# Patient Record
Sex: Female | Born: 1935 | Race: White | Hispanic: No | Marital: Married | Smoking: Never smoker
Health system: Southern US, Community
[De-identification: ages and names within clinical notes are randomized; demographics above are authoritative.]

## PROBLEM LIST (undated history)

## (undated) DIAGNOSIS — C801 Malignant (primary) neoplasm, unspecified: Secondary | ICD-10-CM

## (undated) DIAGNOSIS — E079 Disorder of thyroid, unspecified: Secondary | ICD-10-CM

## (undated) DIAGNOSIS — K56609 Unspecified intestinal obstruction, unspecified as to partial versus complete obstruction: Secondary | ICD-10-CM

## (undated) HISTORY — PX: TONSILLECTOMY: SUR1361

## (undated) HISTORY — PX: APPENDECTOMY: SHX54

## (undated) HISTORY — PX: BREAST SURGERY: SHX581

## (undated) HISTORY — PX: ABDOMINAL HYSTERECTOMY: SHX81

---

## 2016-10-19 ENCOUNTER — Emergency Department (HOSPITAL_BASED_OUTPATIENT_CLINIC_OR_DEPARTMENT_OTHER): Payer: Medicare Other

## 2016-10-19 ENCOUNTER — Inpatient Hospital Stay (HOSPITAL_BASED_OUTPATIENT_CLINIC_OR_DEPARTMENT_OTHER)
Admission: EM | Admit: 2016-10-19 | Discharge: 2016-10-25 | DRG: 390 | Disposition: A | Discharge status: Home / Self Care | Payer: Medicare Other | Attending: Internal Medicine | Admitting: Internal Medicine

## 2016-10-19 ENCOUNTER — Encounter (HOSPITAL_BASED_OUTPATIENT_CLINIC_OR_DEPARTMENT_OTHER): Payer: Self-pay | Admitting: Emergency Medicine

## 2016-10-19 DIAGNOSIS — E119 Type 2 diabetes mellitus without complications: Secondary | ICD-10-CM

## 2016-10-19 DIAGNOSIS — Z79899 Other long term (current) drug therapy: Secondary | ICD-10-CM

## 2016-10-19 DIAGNOSIS — E039 Hypothyroidism, unspecified: Secondary | ICD-10-CM | POA: Diagnosis not present

## 2016-10-19 DIAGNOSIS — Z8542 Personal history of malignant neoplasm of other parts of uterus: Secondary | ICD-10-CM

## 2016-10-19 DIAGNOSIS — E875 Hyperkalemia: Secondary | ICD-10-CM | POA: Diagnosis not present

## 2016-10-19 DIAGNOSIS — Z9049 Acquired absence of other specified parts of digestive tract: Secondary | ICD-10-CM

## 2016-10-19 DIAGNOSIS — Z4659 Encounter for fitting and adjustment of other gastrointestinal appliance and device: Secondary | ICD-10-CM

## 2016-10-19 DIAGNOSIS — R109 Unspecified abdominal pain: Secondary | ICD-10-CM | POA: Diagnosis not present

## 2016-10-19 DIAGNOSIS — Z0189 Encounter for other specified special examinations: Secondary | ICD-10-CM

## 2016-10-19 DIAGNOSIS — K589 Irritable bowel syndrome without diarrhea: Secondary | ICD-10-CM | POA: Diagnosis present

## 2016-10-19 DIAGNOSIS — Z853 Personal history of malignant neoplasm of breast: Secondary | ICD-10-CM

## 2016-10-19 DIAGNOSIS — E876 Hypokalemia: Secondary | ICD-10-CM | POA: Diagnosis not present

## 2016-10-19 DIAGNOSIS — K56609 Unspecified intestinal obstruction, unspecified as to partial versus complete obstruction: Secondary | ICD-10-CM | POA: Diagnosis not present

## 2016-10-19 DIAGNOSIS — Z90710 Acquired absence of both cervix and uterus: Secondary | ICD-10-CM

## 2016-10-19 DIAGNOSIS — Z800 Family history of malignant neoplasm of digestive organs: Secondary | ICD-10-CM

## 2016-10-19 DIAGNOSIS — Z8249 Family history of ischemic heart disease and other diseases of the circulatory system: Secondary | ICD-10-CM

## 2016-10-19 DIAGNOSIS — Z79890 Hormone replacement therapy (postmenopausal): Secondary | ICD-10-CM

## 2016-10-19 DIAGNOSIS — R030 Elevated blood-pressure reading, without diagnosis of hypertension: Secondary | ICD-10-CM | POA: Diagnosis present

## 2016-10-19 DIAGNOSIS — K56600 Partial intestinal obstruction, unspecified as to cause: Principal | ICD-10-CM | POA: Diagnosis present

## 2016-10-19 DIAGNOSIS — Z808 Family history of malignant neoplasm of other organs or systems: Secondary | ICD-10-CM

## 2016-10-19 HISTORY — DX: Malignant (primary) neoplasm, unspecified: C80.1

## 2016-10-19 HISTORY — DX: Unspecified intestinal obstruction, unspecified as to partial versus complete obstruction: K56.609

## 2016-10-19 LAB — CBC WITH DIFFERENTIAL/PLATELET
BASOS ABS: 0 10*3/uL (ref 0.0–0.1)
BASOS PCT: 0 %
EOS ABS: 0.1 10*3/uL (ref 0.0–0.7)
EOS PCT: 1 %
HCT: 37.9 % (ref 36.0–46.0)
Hemoglobin: 12.8 g/dL (ref 12.0–15.0)
LYMPHS PCT: 16 %
Lymphs Abs: 1.8 10*3/uL (ref 0.7–4.0)
MCH: 33.9 pg (ref 26.0–34.0)
MCHC: 33.8 g/dL (ref 30.0–36.0)
MCV: 100.3 fL — AB (ref 78.0–100.0)
Monocytes Absolute: 1.1 10*3/uL — ABNORMAL HIGH (ref 0.1–1.0)
Monocytes Relative: 10 %
Neutro Abs: 8 10*3/uL — ABNORMAL HIGH (ref 1.7–7.7)
Neutrophils Relative %: 73 %
PLATELETS: 282 10*3/uL (ref 150–400)
RBC: 3.78 MIL/uL — AB (ref 3.87–5.11)
RDW: 11.9 % (ref 11.5–15.5)
WBC: 11 10*3/uL — AB (ref 4.0–10.5)

## 2016-10-19 LAB — COMPREHENSIVE METABOLIC PANEL
ALT: 16 U/L (ref 14–54)
AST: 23 U/L (ref 15–41)
Albumin: 4.3 g/dL (ref 3.5–5.0)
Alkaline Phosphatase: 86 U/L (ref 38–126)
Anion gap: 10 (ref 5–15)
BUN: 14 mg/dL (ref 6–20)
CALCIUM: 9.5 mg/dL (ref 8.9–10.3)
CO2: 27 mmol/L (ref 22–32)
Chloride: 98 mmol/L — ABNORMAL LOW (ref 101–111)
Creatinine, Ser: 0.72 mg/dL (ref 0.44–1.00)
GFR calc Af Amer: >60 mL/min (ref >60)
GFR calc non Af Amer: >60 mL/min (ref >60)
GLUCOSE: 110 mg/dL — AB (ref 65–99)
POTASSIUM: 4.1 mmol/L (ref 3.5–5.1)
SODIUM: 135 mmol/L (ref 135–145)
TOTAL PROTEIN: 7.5 g/dL (ref 6.5–8.1)
Total Bilirubin: 0.6 mg/dL (ref 0.3–1.2)

## 2016-10-19 LAB — URINALYSIS, MICROSCOPIC (REFLEX)

## 2016-10-19 LAB — URINALYSIS, ROUTINE W REFLEX MICROSCOPIC
Bilirubin Urine: NEGATIVE
GLUCOSE, UA: NEGATIVE mg/dL
Hgb urine dipstick: NEGATIVE
KETONES UR: NEGATIVE mg/dL
NITRITE: NEGATIVE
PROTEIN: NEGATIVE mg/dL
Specific Gravity, Urine: 1.045 — ABNORMAL HIGH (ref 1.005–1.030)
pH: 7.5 (ref 5.0–8.0)

## 2016-10-19 LAB — GLUCOSE, CAPILLARY: GLUCOSE-CAPILLARY: 150 mg/dL — AB (ref 65–99)

## 2016-10-19 LAB — LIPASE, BLOOD: LIPASE: 26 U/L (ref 11–51)

## 2016-10-19 MED ORDER — ACETAMINOPHEN 650 MG RE SUPP: 650.0000 mg | Freq: Four times a day (QID) | RECTAL | Status: DC | PRN

## 2016-10-19 MED ORDER — SODIUM CHLORIDE 0.9 % IV SOLN
INTRAVENOUS | Status: DC
  Administered 2016-10-19 – 2016-10-21 (×4): via INTRAVENOUS
  Administered 2016-10-21 – 2016-10-23 (×2): 1000 mL via INTRAVENOUS

## 2016-10-19 MED ORDER — ACETAMINOPHEN 325 MG PO TABS: 650.0000 mg | ORAL_TABLET | Freq: Four times a day (QID) | ORAL | Status: DC | PRN

## 2016-10-19 MED ORDER — FENTANYL CITRATE (PF) 100 MCG/2ML IJ SOLN
50.0000 ug | Freq: Once | INTRAMUSCULAR | Status: AC
  Administered 2016-10-19: 50 ug via INTRAVENOUS
  Filled 2016-10-19: qty 2

## 2016-10-19 MED ORDER — ONDANSETRON HCL 4 MG/2ML IJ SOLN
4.0000 mg | Freq: Once | INTRAMUSCULAR | Status: AC
  Administered 2016-10-19: 4 mg via INTRAVENOUS
  Filled 2016-10-19: qty 2

## 2016-10-19 MED ORDER — LEVOTHYROXINE SODIUM 100 MCG IV SOLR
25.0000 ug | Freq: Every day | INTRAVENOUS | Status: DC
  Administered 2016-10-20 – 2016-10-24 (×5): 25 ug via INTRAVENOUS
  Filled 2016-10-19 (×6): qty 5

## 2016-10-19 MED ORDER — LIDOCAINE HCL 2 % EX GEL
1.0000 "application " | Freq: Once | CUTANEOUS | Status: AC
  Administered 2016-10-19: 1 via TOPICAL
  Filled 2016-10-19: qty 20

## 2016-10-19 MED ORDER — SODIUM CHLORIDE 0.9 % IV BOLUS (SEPSIS)
500.0000 mL | Freq: Once | INTRAVENOUS | Status: AC
  Administered 2016-10-19: 500 mL via INTRAVENOUS

## 2016-10-19 MED ORDER — IOPAMIDOL (ISOVUE-300) INJECTION 61%
100.0000 mL | Freq: Once | INTRAVENOUS | Status: AC | PRN
  Administered 2016-10-19: 100 mL via INTRAVENOUS

## 2016-10-19 MED ORDER — HYDRALAZINE HCL 20 MG/ML IJ SOLN
10.0000 mg | Freq: Four times a day (QID) | INTRAMUSCULAR | Status: DC | PRN
  Administered 2016-10-19: 10 mg via INTRAVENOUS
  Filled 2016-10-19: qty 0.5

## 2016-10-19 MED ORDER — INSULIN ASPART 100 UNIT/ML ~~LOC~~ SOLN
0.0000 [IU] | SUBCUTANEOUS | Status: DC
  Administered 2016-10-19 – 2016-10-24 (×11): 1 [IU] via SUBCUTANEOUS

## 2016-10-19 MED ORDER — ONDANSETRON HCL 4 MG/2ML IJ SOLN
4.0000 mg | Freq: Four times a day (QID) | INTRAMUSCULAR | Status: DC | PRN
  Administered 2016-10-19 – 2016-10-20 (×3): 4 mg via INTRAVENOUS
  Filled 2016-10-19 (×3): qty 2

## 2016-10-19 MED ORDER — MORPHINE SULFATE (PF) 2 MG/ML IV SOLN
2.0000 mg | INTRAVENOUS | Status: DC | PRN
  Administered 2016-10-19 – 2016-10-22 (×2): 2 mg via INTRAVENOUS
  Filled 2016-10-19 (×2): qty 1

## 2016-10-19 MED ORDER — ENOXAPARIN SODIUM 40 MG/0.4ML ~~LOC~~ SOLN
40.0000 mg | SUBCUTANEOUS | Status: DC
  Administered 2016-10-19 – 2016-10-24 (×6): 40 mg via SUBCUTANEOUS
  Filled 2016-10-19 (×6): qty 0.4

## 2016-10-19 NOTE — ED Triage Notes (Signed)
Pt reports sharp shooting pains across upper abdomen since 0900.  Pt reports last bowel movt this morning and has tried pepto and green tea with no relief at home.  Pt states she had an intestinal blockage 20 years ago and this reminds her of that pain.  Comes in waves.

## 2016-10-19 NOTE — ED Provider Notes (Signed)
[facility] [place] DEPT [facility] Provider Note   CSN: [id number] Arrival date & time: 10/19/16  1244  By signing my name below, I, [name], attest that this documentation has been prepared under the direction and in the presence of Hongalgi, Lenis Dickinson, MD. Electronically Signed: [name], Scribe. 10/19/16. 3:32 PM. History   Chief Complaint Chief Complaint  Patient presents with  . Abdominal Pain   The history is provided by the patient. No language interpreter was used.    HPI Comments: Kendra Ortega is a 83 y.o. female with PMHx of DM who presents to the Emergency Department complaining of waxing/waning sharp abdominal pain since this morning. She states when the pain is not sharp, it is a constant soreness. She has associated abdominal swelling, nausea, vomiting (1 episode with clear liquid), and constipation (her last BM was normal this morning). She has tried [employer] and drinking green tea with no relief. When these did not help, she went to a nurse at her assisted living home, they took her vitals and she reported high blood pressure of 175/83, who recommended she come to the ED. 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 She denies fever, CP, SOB, diarrhea, and dysuria. She has NKDA. Past Medical History:  Diagnosis Date  . Bowel obstruction ([place])   . Cancer [facility])    right breast cancer    Patient Active Problem List   Diagnosis Date Noted  . Small bowel obstruction ([place]) 10/19/2016  . SBO (small bowel obstruction) ([place]) 10/19/2016  . Type 2 diabetes mellitus without complication ([place]) 62/22/9798  . Hypothyroidism 10/19/2016  . IBS (irritable bowel syndrome) 10/19/2016  . History of breast cancer 10/19/2016  . History of uterine cancer 10/19/2016    Past Surgical History:  Procedure Laterality Date  . ABDOMINAL HYSTERECTOMY    . APPENDECTOMY    . BREAST SURGERY     lumpectomy  . TONSILLECTOMY      OB History    No  data available       Home Medications    Prior to Admission medications   Medication Sig Start Date End Date Taking? Authorizing Provider  calcium carbonate (CALCIUM 600) 600 MG TABS tablet Take 600 mg by mouth every other day. 01/28/12  Yes [provider]  Cholecalciferol (VITAMIN D3) 1000 units CAPS Take 1,000 Units by mouth every other day. 03/01/13  Yes [provider]  docusate sodium (COLACE) 100 MG capsule Take 100 mg by mouth 2 (two) times daily. 01/28/12  Yes [provider]  glimepiride (AMARYL) 1 MG tablet Take 1 mg by mouth daily with breakfast.   Yes [provider]  levothyroxine (SYNTHROID, LEVOTHROID) 50 MCG tablet Take 50 mcg by mouth daily before breakfast.   Yes [provider]  Multiple Vitamins-Minerals (MULTIVITAMIN WOMEN PO) Take 1 tablet by mouth daily.   Yes [provider]    Family History No family history on file.  Social History Social History  Substance Use Topics  . Smoking status: Never Smoker  . Smokeless tobacco: Never Used  . Alcohol use Yes     Comment: some days     Allergies   Patient has no known allergies.   Review of Systems Review of Systems  Constitutional: Negative for fever.  Respiratory: Negative for shortness of breath.   Cardiovascular: Negative for chest pain.  Gastrointestinal: Positive for abdominal pain, constipation, nausea and vomiting. Negative for diarrhea.  Genitourinary: Negative  for dysuria.  All other systems reviewed and are negative.    Physical Exam Updated Vital Signs BP (!) 186/82 (BP Location: Left Arm)   Pulse 65   Temp 98.1 F (36.7 C) (Oral)   Resp 18   Ht 5' 4.5" (1.638 m)   Wt 73.5 kg (162 lb)   SpO2 97%   BMI 27.38 kg/m   Physical Exam  Constitutional: She is oriented to person, place, and time. She appears well-developed and well-nourished.  HENT:  Head: Normocephalic and atraumatic.  Cardiovascular: Normal rate and regular  rhythm.   No murmur heard. Pulmonary/Chest: Effort normal and breath sounds normal. No respiratory distress.  Abdominal: Soft. She exhibits distension. There is tenderness. There is no rebound and no guarding.  Distended abdomen that is tympanic to percussion Decreased bowel sounds with mild diffuse tenderness No guarding or rebound  Musculoskeletal: She exhibits no edema or tenderness.  Neurological: She is alert and oriented to person, place, and time.  Skin: Skin is warm and dry.  Nonpitting edema to bilateral lower extremities  Psychiatric: She has a normal mood and affect. Her behavior is normal.  Nursing note and vitals reviewed.    ED Treatments / Results  DIAGNOSTIC STUDIES: Oxygen Saturation is 98% on RA, normal by my interpretation.    COORDINATION OF CARE: 3:28 PM Discussed treatment plan with pt at bedside and pt agreed to plan.  Labs (all labs ordered are listed, but only abnormal results are displayed) Labs Reviewed  CBC WITH DIFFERENTIAL/PLATELET - Abnormal; Notable for the following:       Result Value   WBC 11.0 (*)    RBC 3.78 (*)    MCV 100.3 (*)    Neutro Abs 8.0 (*)    Monocytes Absolute 1.1 (*)    All other components within normal limits  COMPREHENSIVE METABOLIC PANEL - Abnormal; Notable for the following:    Chloride 98 (*)    Glucose, Bld 110 (*)    All other components within normal limits  URINALYSIS, ROUTINE W REFLEX MICROSCOPIC - Abnormal; Notable for the following:    Specific Gravity, Urine 1.045 (*)    Leukocytes, UA MODERATE (*)    All other components within normal limits  URINALYSIS, MICROSCOPIC (REFLEX) - Abnormal; Notable for the following:    Bacteria, UA MANY (*)    Squamous Epithelial / LPF 0-5 (*)    All other components within normal limits  GLUCOSE, CAPILLARY - Abnormal; Notable for the following:    Glucose-Capillary 150 (*)    All other components within normal limits  URINE CULTURE  LIPASE, BLOOD  APTT  PROTIME-INR     EKG  EKG Interpretation  Date/Time:  Saturday October 19 2016 15:37:20 EDT Ventricular Rate:  55 PR Interval:    QRS Duration: 102 QT Interval:  428 QTC Calculation: 410 R Axis:   31 Text Interpretation:  Sinus rhythm Low voltage, precordial leads Baseline wander in lead(s) II Confirmed by Hazle Coca [phone]) on 10/19/2016 4:05:11 PM       Radiology Ct Abdomen Pelvis W Contrast  Addendum Date: 10/19/2016   ADDENDUM REPORT: 10/19/2016 16:38 ADDENDUM: Voice recognition error in the first sentence of the first impression which should read Evidence for small bowel obstruction in the mid to distal small bowel. Electronically Signed   By: Rolm Baptise M.D.   On: 10/19/2016 16:38   Result Date: 10/19/2016 CLINICAL DATA:  Upper abdominal pain.  History of bowel obstruction. EXAM: CT ABDOMEN AND PELVIS WITH  CONTRAST TECHNIQUE: Multidetector CT imaging of the abdomen and pelvis was performed using the standard protocol following bolus administration of intravenous contrast. CONTRAST:  186mL ISOVUE-300 IOPAMIDOL (ISOVUE-300) INJECTION 61% COMPARISON:  None. FINDINGS: Lower chest: Linear scarring in the lung bases. No effusions. Heart is normal size. Hepatobiliary: No focal hepatic abnormality. Gallbladder unremarkable. Pancreas: No focal abnormality or ductal dilatation. Spleen: No focal abnormality.  Normal size. Adrenals/Urinary Tract: Small low-density nodule in the right adrenal gland, difficult to characterize on this contrast-enhanced study, but likely a small adenoma. This measures 15 mm. Left adrenal gland and kidneys are unremarkable. Urinary bladder unremarkable. Stomach/Bowel: Dilated proximal and mid small bowel loops. Distal small bowel loops are decompressed. Findings compatible with small bowel obstruction. Exact transition and cause not identified. Vascular/Lymphatic: Aortic and iliac calcifications. No aneurysm or adenopathy. Reproductive: Prior hysterectomy.  No adnexal masses. Other:  Trace free fluid in the pelvis.  No free air. Musculoskeletal: No acute bony abnormality. IMPRESSION: No evidence for small bowel obstruction in the mid to distal small bowel. Exact transition and cause not visualized. Prior hysterectomy. Aortoiliac atherosclerosis. Electronically Signed: By: Rolm Baptise M.D. On: 10/19/2016 16:23    Procedures Procedures (including critical care time)  Medications Ordered in ED Medications  ondansetron (ZOFRAN) injection 4 mg (4 mg Intravenous Given 10/19/16 2143)  levothyroxine (SYNTHROID, LEVOTHROID) injection 25 mcg (not administered)  insulin aspart (novoLOG) injection 0-9 Units (1 Units Subcutaneous Given 10/19/16 2249)  enoxaparin (LOVENOX) injection 40 mg (40 mg Subcutaneous Given 10/19/16 2235)  0.9 %  sodium chloride infusion ( Intravenous New Bag/Given 10/19/16 2144)  acetaminophen (TYLENOL) tablet 650 mg (not administered)    Or  acetaminophen (TYLENOL) suppository 650 mg (not administered)  morphine 2 MG/ML injection 2 mg (2 mg Intravenous Given 10/19/16 2250)  hydrALAZINE (APRESOLINE) injection 10 mg (10 mg Intravenous Given 10/19/16 2235)  ondansetron (ZOFRAN) injection 4 mg (4 mg Intravenous Given 10/19/16 1519)  ondansetron (ZOFRAN) injection 4 mg (4 mg Intravenous Given 10/19/16 1606)  sodium chloride 0.9 % bolus 500 mL (0 mLs Intravenous Stopped 10/19/16 1841)  fentaNYL (SUBLIMAZE) injection 50 mcg (50 mcg Intravenous Given 10/19/16 1606)  iopamidol (ISOVUE-300) 61 % injection 100 mL (100 mLs Intravenous Contrast Given 10/19/16 1547)  lidocaine (XYLOCAINE) 2 % jelly 1 application (1 application Topical Given 10/19/16 1729)  fentaNYL (SUBLIMAZE) injection 50 mcg (50 mcg Intravenous Given 10/19/16 1752)     Initial Impression / Assessment and Plan / ED Course  I have reviewed the triage vital signs and the nursing notes.  Pertinent labs & imaging results that were available during my care of the patient were reviewed by me and considered in my  medical decision making (see chart for details).    Patient here with upper abdominal pain, vomiting. She has a history of similar symptoms with obstruction requiring surgical treatment in the past. NG tube placed for decompression and patient comfort.  Discussed with Dr. Marlou Starks with general surgery. He recommends transfer to [facility] long with medicine consult. Hospitalist contacted for admission - discussed with Dr. Algis Liming. Patient updated findings of studies and recommendation for admission and she is in agreement with plan.  Final Clinical Impressions(s) / ED Diagnoses   Final diagnoses:  Small bowel obstruction [facility])    New Prescriptions Current Discharge Medication List    I personally performed the services described in this documentation, which was scribed in my presence. The recorded information has been reviewed and is accurate.    Quintella Reichert, MD 10/19/16 2258

## 2016-10-19 NOTE — Progress Notes (Signed)
Acceptance note  I received a call from Dr. Marin Roberts, EDP at [facility] regarding accepting this patient for admission to [facility].  83 year old female, PMH of bowel obstruction requiring surgical repair several years ago, type II DM, hypothyroid, IBS, presented with abdominal pain and workup suggests SBO. Per report, apart from mildly elevated blood pressures (not a known hypertensive), hemodynamically stable and not in respiratory distress. Dr. Ayesha Rumpf discussed patient's case with Dr. Marlou Starks, General Surgery who recommended hospitalist admission and he will see in consult. Patient has received IV fluids, pain medications and is awaiting NG tube placement.  Accepted to medical bed, observation status. Please call general surgery when patient arrives to [facility].  [place], MD, FACP, [facility]. [facility] Pager [phone]  If 7PM-7AM, please contact night-coverage [email or web address] Password [email or web address] 10/19/2016, 5:26 PM

## 2016-10-19 NOTE — Consult Note (Signed)
Reason for Consult:bloating Referring Physician: Dr. [name] is an 83 y.o. female.  HPI:  The patient is an 83 year old white female who presents from an assisted living home with abdominal bloating that started this morning. She had a bowel obstruction requiring surgery about 20 years ago and states that this feels similar to the way she felt then. She has had one episode of nausea and vomiting. She has passed some flatus today and had a small bowel movement this morning. Her CT scan shows evidence of small bowel obstruction  Past Medical History:  Diagnosis Date  . Bowel obstruction ([place])   . Cancer [facility])    right breast cancer    Past Surgical History:  Procedure Laterality Date  . ABDOMINAL HYSTERECTOMY    . APPENDECTOMY    . BREAST SURGERY     lumpectomy  . TONSILLECTOMY      No family history on file.  Social History:  reports that she has never smoked. She has never used smokeless tobacco. She reports that she drinks alcohol. She reports that she does not use drugs.  Allergies: No Known Allergies  Medications: I have reviewed the patient's current medications.  Results for orders placed or performed during the hospital encounter of 10/19/16 (from the past 48 hour(s))  CBC with Differential     Status: Abnormal   Collection Time: 10/19/16  2:00 PM  Result Value Ref Range   WBC 11.0 (H) 4.0 - 10.5 K/uL   RBC 3.78 (L) 3.87 - 5.11 MIL/uL   Hemoglobin 12.8 12.0 - 15.0 g/dL   HCT 37.9 36.0 - 46.0 %   MCV 100.3 (H) 78.0 - 100.0 fL   MCH 33.9 26.0 - 34.0 pg   MCHC 33.8 30.0 - 36.0 g/dL   RDW 11.9 11.5 - 15.5 %   Platelets 282 150 - 400 K/uL   Neutrophils Relative % 73 %   Neutro Abs 8.0 (H) 1.7 - 7.7 K/uL   Lymphocytes Relative 16 %   Lymphs Abs 1.8 0.7 - 4.0 K/uL   Monocytes Relative 10 %   Monocytes Absolute 1.1 (H) 0.1 - 1.0 K/uL   Eosinophils Relative 1 %   Eosinophils Absolute 0.1 0.0 - 0.7 K/uL   Basophils Relative 0 %   Basophils Absolute 0.0  0.0 - 0.1 K/uL  Comprehensive metabolic panel     Status: Abnormal   Collection Time: 10/19/16  2:00 PM  Result Value Ref Range   Sodium 135 135 - 145 mmol/L   Potassium 4.1 3.5 - 5.1 mmol/L   Chloride 98 (L) 101 - 111 mmol/L   CO2 27 22 - 32 mmol/L   Glucose, Bld 110 (H) 65 - 99 mg/dL   BUN 14 6 - 20 mg/dL   Creatinine, Ser 0.72 0.44 - 1.00 mg/dL   Calcium 9.5 8.9 - 10.3 mg/dL   Total Protein 7.5 6.5 - 8.1 g/dL   Albumin 4.3 3.5 - 5.0 g/dL   AST 23 15 - 41 U/L   ALT 16 14 - 54 U/L   Alkaline Phosphatase 86 38 - 126 U/L   Total Bilirubin 0.6 0.3 - 1.2 mg/dL   GFR calc non Af Amer >60 >60 mL/min   GFR calc Af Amer >60 >60 mL/min    Comment: (NOTE) The eGFR has been calculated using the CKD EPI equation. This calculation has not been validated in all clinical situations. eGFR's persistently <60 mL/min signify possible Chronic Kidney Disease.    Anion gap 10  5 - 15  Lipase, blood     Status: None   Collection Time: 10/19/16  2:00 PM  Result Value Ref Range   Lipase 26 11 - 51 U/L  Urinalysis, Routine w reflex microscopic     Status: Abnormal   Collection Time: 10/19/16  5:15 PM  Result Value Ref Range   Color, Urine YELLOW YELLOW   APPearance CLEAR CLEAR   Specific Gravity, Urine 1.045 (H) 1.005 - 1.030   pH 7.5 5.0 - 8.0   Glucose, UA NEGATIVE NEGATIVE mg/dL   Hgb urine dipstick NEGATIVE NEGATIVE   Bilirubin Urine NEGATIVE NEGATIVE   Ketones, ur NEGATIVE NEGATIVE mg/dL   Protein, ur NEGATIVE NEGATIVE mg/dL   Nitrite NEGATIVE NEGATIVE   Leukocytes, UA MODERATE (A) NEGATIVE  Urinalysis, Microscopic (reflex)     Status: Abnormal   Collection Time: 10/19/16  5:15 PM  Result Value Ref Range   RBC / HPF 0-5 0 - 5 RBC/hpf   WBC, UA 6-30 0 - 5 WBC/hpf   Bacteria, UA MANY (A) NONE SEEN   Squamous Epithelial / LPF 0-5 (A) NONE SEEN  Glucose, capillary     Status: Abnormal   Collection Time: 10/19/16 10:33 PM  Result Value Ref Range   Glucose-Capillary 150 (H) 65 - 99 mg/dL     Ct Abdomen Pelvis W Contrast  Addendum Date: 10/19/2016   ADDENDUM REPORT: 10/19/2016 16:38 ADDENDUM: Voice recognition error in the first sentence of the first impression which should read Evidence for small bowel obstruction in the mid to distal small bowel. Electronically Signed   By: Kendra Ortega M.D.   On: 10/19/2016 16:38   Result Date: 10/19/2016 CLINICAL DATA:  Upper abdominal pain.  History of bowel obstruction. EXAM: CT ABDOMEN AND PELVIS WITH CONTRAST TECHNIQUE: Multidetector CT imaging of the abdomen and pelvis was performed using the standard protocol following bolus administration of intravenous contrast. CONTRAST:  134m ISOVUE-300 IOPAMIDOL (ISOVUE-300) INJECTION 61% COMPARISON:  None. FINDINGS: Lower chest: Linear scarring in the lung bases. No effusions. Heart is normal size. Hepatobiliary: No focal hepatic abnormality. Gallbladder unremarkable. Pancreas: No focal abnormality or ductal dilatation. Spleen: No focal abnormality.  Normal size. Adrenals/Urinary Tract: Small low-density nodule in the right adrenal gland, difficult to characterize on this contrast-enhanced study, but likely a small adenoma. This measures 15 mm. Left adrenal gland and kidneys are unremarkable. Urinary bladder unremarkable. Stomach/Bowel: Dilated proximal and mid small bowel loops. Distal small bowel loops are decompressed. Findings compatible with small bowel obstruction. Exact transition and cause not identified. Vascular/Lymphatic: Aortic and iliac calcifications. No aneurysm or adenopathy. Reproductive: Prior hysterectomy.  No adnexal masses. Other: Trace free fluid in the pelvis.  No free air. Musculoskeletal: No acute bony abnormality. IMPRESSION: No evidence for small bowel obstruction in the mid to distal small bowel. Exact transition and cause not visualized. Prior hysterectomy. Aortoiliac atherosclerosis. Electronically Signed: By: KRolm BaptiseM.D. On: 10/19/2016 16:23    Review of Systems   Constitutional: Negative.   HENT: Negative.   Eyes: Negative.   Respiratory: Negative.   Cardiovascular: Negative.   Gastrointestinal: Positive for abdominal pain, nausea and vomiting.  Genitourinary: Negative.   Musculoskeletal: Negative.   Skin: Negative.   Neurological: Negative.   Endo/Heme/Allergies: Negative.   Psychiatric/Behavioral: Negative.    Blood pressure (!) 186/82, pulse 65, temperature 98.1 F (36.7 C), temperature source Oral, resp. rate 18, height 5' 4.5" (1.638 m), weight 73.5 kg (162 lb), SpO2 97 %. Physical Exam  Constitutional: She is oriented  to person, place, and time. She appears well-developed and well-nourished. No distress.  HENT:  Head: Normocephalic and atraumatic.  Mouth/Throat: No oropharyngeal exudate.  Eyes: Conjunctivae and EOM are normal. Pupils are equal, round, and reactive to light.  Neck: Normal range of motion. Neck supple.  Cardiovascular: Normal rate, regular rhythm and normal heart sounds.   Respiratory: Effort normal and breath sounds normal. No stridor. No respiratory distress.  GI: Soft. Bowel sounds are normal. She exhibits distension. There is no tenderness.  Musculoskeletal: Normal range of motion. She exhibits no edema or tenderness.  Neurological: She is alert and oriented to person, place, and time. Coordination normal.  Skin: Skin is warm and dry. No rash noted.  Psychiatric: She has a normal mood and affect. Her behavior is normal. Thought content normal.    Assessment/Plan:  The patient appears to have a small bowel obstruction. She has some bloating but no abdominal tenderness. At this point a thick it is reasonable to treat her with bowel rest and IV hydration. We will start the small bowel protocol in the morning and follow serial x-rays of her abdomen as well as physical exam. If she does not improve over the next few days she may require exploration. I have discussed all this with her and she is in agreement.  TOTH  III,Cai Flott S 10/19/2016, 11:25 PM

## 2016-10-19 NOTE — ED Notes (Signed)
Unable to give urine at this time

## 2016-10-19 NOTE — H&P (Signed)
History and Physical    Kendra Ortega GEX:[id number] DOB: October 20, 1931 DOA: 10/19/2016  PCP: Javier Glazier, MD (Lives in [place])  Patient coming from: [facility]  Chief Complaint: Abdominal pain  HPI: Kendra Ortega is a 83 y.o. woman with a history of breast and uterine cancers (in remission, treated at [facility]), SBO requiring surgical intervention in 1998, Type 2 DM, hypothyroidism, and IBS who was in her baseline state of health until she developed abdominal pain this morning around 9AM.  She has had sharp, intermittent pain in her epigastric and RUQ areas with diffuse abdominal soreness.  Pain is 10 out of 10 in intensity at its worst.  She has had one episode of vomiting but persistent nausea.  Symptoms not relieved with pepto bismol and green tea.  No fever.  No light-headedness.  No chest pain or shortness of breath.  Last bowel movement was this AM around 8 and it was normal.  She has been passing gas.  No LUTS.  No swelling.  ED Course: CT shows evidence of small bowel obstruction in the the mid to distal small bowel with dilated small bowel loops.  WBC count 11.  U/A shows moderate leukocytes, 6-30 WBC, many bacteria.  Antibiotics deferred for now.  500cc NS bolus given.  NG tube placed.  General Surgery consulted.  Hospitalist asked to admit.  Review of Systems: As per HPI otherwise 10 systems reviewed and negative.   Past Medical History:  Diagnosis Date  . Bowel obstruction ([place])   . Cancer [facility])    right breast cancer  Breast Cancer Uterine Cancer IBS Hypothyroidism  Past Surgical History:  Procedure Laterality Date  . ABDOMINAL HYSTERECTOMY    . APPENDECTOMY    . BREAST SURGERY     lumpectomy  . TONSILLECTOMY       reports that she has never smoked. She has never used smokeless tobacco. She reports that she drinks alcohol. She reports that she does not use drugs.  She is married.  She has two adult children.  No Known Allergies  FAMILY HISTORY: Mother had  colon cancer. One sister had an oral (tongue) cancer. Father had angina.  Prior to Admission medications   Medication Sig Start Date End Date Taking? Authorizing Provider  glimepiride (AMARYL) 1 MG tablet Take 1 mg by mouth daily with breakfast.   Yes [provider]  levothyroxine (SYNTHROID, LEVOTHROID) 50 MCG tablet Take 50 mcg by mouth daily before breakfast.   Yes [provider]    Physical Exam: Vitals:   10/19/16 1830 10/19/16 1900 10/19/16 1930 10/19/16 1957  BP: (!) 171/92 (!) 174/73 (!) 184/73 (!) 163/80  Pulse: 79 69 83 75  Resp: 20 18 18 17   Temp:      TempSrc:      SpO2: 95% 97% 97% 99%  Weight:      Height:          Constitutional: NAD, calm, comfortable, NONtoxic appearing, pleasant Vitals:   10/19/16 1830 10/19/16 1900 10/19/16 1930 10/19/16 1957  BP: (!) 171/92 (!) 174/73 (!) 184/73 (!) 163/80  Pulse: 79 69 83 75  Resp: 20 18 18 17   Temp:      TempSrc:      SpO2: 95% 97% 97% 99%  Weight:      Height:       Eyes: pupils equal, lids and conjunctivae normal ENMT: Mucous membranes are moist.  Normal dentition. NG tube in place. Neck: normal appearance, supple, no masses Respiratory: clear  to auscultation bilaterally, no wheezing, no crackles. Normal respiratory effort. No accessory muscle use.  Cardiovascular: Normal rate, regular rhythm, no murmurs / rubs / gallops. No extremity edema. 2+ pedal pulses.  GI: abdomen is soft and compressible with mild distention.  No significant guarding.  Mild tenderness.  Bowel sounds are hypoactive. Musculoskeletal:  No joint deformity in upper and lower extremities. Good ROM, no contractures. Normal muscle tone.  Skin: no rashes, warm and dry Neurologic: No focal deficits. Psychiatric: Normal judgment and insight. Alert and oriented x 3. Normal mood.     Labs on Admission: I have personally reviewed following labs and imaging studies  CBC:  Recent Labs Lab 10/19/16 1400  WBC 11.0*  NEUTROABS  8.0*  HGB 12.8  HCT 37.9  MCV 100.3*  PLT 308   Basic Metabolic Panel:  Recent Labs Lab 10/19/16 1400  NA 135  K 4.1  CL 98*  CO2 27  GLUCOSE 110*  BUN 14  CREATININE 0.72  CALCIUM 9.5   GFR: Estimated Creatinine Clearance: 53.8 mL/min (by C-G formula based on SCr of 0.72 mg/dL). Liver Function Tests:  Recent Labs Lab 10/19/16 1400  AST 23  ALT 16  ALKPHOS 86  BILITOT 0.6  PROT 7.5  ALBUMIN 4.3    Recent Labs Lab 10/19/16 1400  LIPASE 26   Urine analysis:    Component Value Date/Time   COLORURINE YELLOW 10/19/2016 1715   APPEARANCEUR CLEAR 10/19/2016 1715   LABSPEC 1.045 (H) 10/19/2016 1715   PHURINE 7.5 10/19/2016 1715   GLUCOSEU NEGATIVE 10/19/2016 1715   HGBUR NEGATIVE 10/19/2016 [place] 10/19/2016 1715   KETONESUR NEGATIVE 10/19/2016 1715   PROTEINUR NEGATIVE 10/19/2016 1715   NITRITE NEGATIVE 10/19/2016 1715   LEUKOCYTESUR MODERATE (A) 10/19/2016 1715    Radiological Exams on Admission: Ct Abdomen Pelvis W Contrast  Addendum Date: 10/19/2016   ADDENDUM REPORT: 10/19/2016 16:38 ADDENDUM: Voice recognition error in the first sentence of the first impression which should read Evidence for small bowel obstruction in the mid to distal small bowel. Electronically Signed   By: Rolm Baptise M.D.   On: 10/19/2016 16:38   Result Date: 10/19/2016 CLINICAL DATA:  Upper abdominal pain.  History of bowel obstruction. EXAM: CT ABDOMEN AND PELVIS WITH CONTRAST TECHNIQUE: Multidetector CT imaging of the abdomen and pelvis was performed using the standard protocol following bolus administration of intravenous contrast. CONTRAST:  19mL ISOVUE-300 IOPAMIDOL (ISOVUE-300) INJECTION 61% COMPARISON:  None. FINDINGS: Lower chest: Linear scarring in the lung bases. No effusions. Heart is normal size. Hepatobiliary: No focal hepatic abnormality. Gallbladder unremarkable. Pancreas: No focal abnormality or ductal dilatation. Spleen: No focal abnormality.   Normal size. Adrenals/Urinary Tract: Small low-density nodule in the right adrenal gland, difficult to characterize on this contrast-enhanced study, but likely a small adenoma. This measures 15 mm. Left adrenal gland and kidneys are unremarkable. Urinary bladder unremarkable. Stomach/Bowel: Dilated proximal and mid small bowel loops. Distal small bowel loops are decompressed. Findings compatible with small bowel obstruction. Exact transition and cause not identified. Vascular/Lymphatic: Aortic and iliac calcifications. No aneurysm or adenopathy. Reproductive: Prior hysterectomy.  No adnexal masses. Other: Trace free fluid in the pelvis.  No free air. Musculoskeletal: No acute bony abnormality. IMPRESSION: No evidence for small bowel obstruction in the mid to distal small bowel. Exact transition and cause not visualized. Prior hysterectomy. Aortoiliac atherosclerosis. Electronically Signed: By: Rolm Baptise M.D. On: 10/19/2016 16:23    EKG: Independently reviewed. NSR.  No acute ST segment  changes.  Assessment/Plan Principal Problem:   Small bowel obstruction (HCC) Active Problems:   SBO (small bowel obstruction) (HCC)   Type 2 diabetes mellitus without complication (HCC)   Hypothyroidism   IBS (irritable bowel syndrome)   History of breast cancer   History of uterine cancer      SBO --General Surgery consult appreciated --NPO --NG tube to LIS --Anti-emetics and analgesics as needed --NS at 100cc/hr.  May need to add D5 if she becomes hypoglycemic.  Type 2 DM --SSI q4h if needed while NPO, low dose  Hypothyroidism --Will give half home dose via IV while NPO   DVT prophylaxis: Lovenox Code Status: FULL Family Communication: Husband present at the bedside at time of admission. Disposition Plan: To be determined. Consults called: General Surgery Marlou Starks) Admission status: Place in observation, med surg.   TIME SPENT: 60 minutes  Eber Jones MD [facility] Pager  [phone]  If 7PM-7AM, please contact night-coverage [email or web address] Password [email or web address]  10/19/2016, 9:26 PM

## 2016-10-19 NOTE — ED Notes (Signed)
Pt not in room.

## 2016-10-20 ENCOUNTER — Observation Stay (HOSPITAL_COMMUNITY): Payer: Medicare Other

## 2016-10-20 ENCOUNTER — Inpatient Hospital Stay (HOSPITAL_COMMUNITY): Payer: Medicare Other

## 2016-10-20 DIAGNOSIS — Z79899 Other long term (current) drug therapy: Secondary | ICD-10-CM | POA: Diagnosis not present

## 2016-10-20 DIAGNOSIS — Z853 Personal history of malignant neoplasm of breast: Secondary | ICD-10-CM | POA: Diagnosis not present

## 2016-10-20 DIAGNOSIS — R109 Unspecified abdominal pain: Secondary | ICD-10-CM | POA: Diagnosis present

## 2016-10-20 DIAGNOSIS — E119 Type 2 diabetes mellitus without complications: Secondary | ICD-10-CM | POA: Diagnosis present

## 2016-10-20 DIAGNOSIS — Z8249 Family history of ischemic heart disease and other diseases of the circulatory system: Secondary | ICD-10-CM | POA: Diagnosis not present

## 2016-10-20 DIAGNOSIS — Z800 Family history of malignant neoplasm of digestive organs: Secondary | ICD-10-CM | POA: Diagnosis not present

## 2016-10-20 DIAGNOSIS — K56609 Unspecified intestinal obstruction, unspecified as to partial versus complete obstruction: Secondary | ICD-10-CM | POA: Diagnosis not present

## 2016-10-20 DIAGNOSIS — K56600 Partial intestinal obstruction, unspecified as to cause: Secondary | ICD-10-CM | POA: Diagnosis present

## 2016-10-20 DIAGNOSIS — R030 Elevated blood-pressure reading, without diagnosis of hypertension: Secondary | ICD-10-CM | POA: Diagnosis present

## 2016-10-20 DIAGNOSIS — E876 Hypokalemia: Secondary | ICD-10-CM | POA: Diagnosis not present

## 2016-10-20 DIAGNOSIS — E875 Hyperkalemia: Secondary | ICD-10-CM | POA: Diagnosis not present

## 2016-10-20 DIAGNOSIS — K589 Irritable bowel syndrome without diarrhea: Secondary | ICD-10-CM | POA: Diagnosis present

## 2016-10-20 DIAGNOSIS — Z90710 Acquired absence of both cervix and uterus: Secondary | ICD-10-CM | POA: Diagnosis not present

## 2016-10-20 DIAGNOSIS — Z9049 Acquired absence of other specified parts of digestive tract: Secondary | ICD-10-CM | POA: Diagnosis not present

## 2016-10-20 DIAGNOSIS — Z8542 Personal history of malignant neoplasm of other parts of uterus: Secondary | ICD-10-CM | POA: Diagnosis not present

## 2016-10-20 DIAGNOSIS — E039 Hypothyroidism, unspecified: Secondary | ICD-10-CM | POA: Diagnosis present

## 2016-10-20 DIAGNOSIS — Z79890 Hormone replacement therapy (postmenopausal): Secondary | ICD-10-CM | POA: Diagnosis not present

## 2016-10-20 DIAGNOSIS — Z808 Family history of malignant neoplasm of other organs or systems: Secondary | ICD-10-CM | POA: Diagnosis not present

## 2016-10-20 LAB — APTT: aPTT: 28 seconds (ref 24–36)

## 2016-10-20 LAB — GLUCOSE, CAPILLARY
GLUCOSE-CAPILLARY: 116 mg/dL — AB (ref 65–99)
Glucose-Capillary: 90 mg/dL (ref 65–99)
Glucose-Capillary: 117 mg/dL — ABNORMAL HIGH (ref 65–99)
Glucose-Capillary: 124 mg/dL — ABNORMAL HIGH (ref 65–99)
Glucose-Capillary: 143 mg/dL — ABNORMAL HIGH (ref 65–99)

## 2016-10-20 LAB — PROTIME-INR
INR: 1
Prothrombin Time: 13.2 seconds (ref 11.4–15.2)

## 2016-10-20 MED ORDER — DIATRIZOATE MEGLUMINE & SODIUM 66-10 % PO SOLN
90.0000 mL | Freq: Once | ORAL | Status: AC
  Administered 2016-10-20: 90 mL via NASOGASTRIC
  Filled 2016-10-20: qty 90

## 2016-10-20 MED ORDER — ORAL CARE MOUTH RINSE
15.0000 mL | Freq: Two times a day (BID) | OROMUCOSAL | Status: DC
  Administered 2016-10-20 – 2016-10-24 (×8): 15 mL via OROMUCOSAL

## 2016-10-20 MED ORDER — CHLORHEXIDINE GLUCONATE 0.12 % MT SOLN
15.0000 mL | Freq: Two times a day (BID) | OROMUCOSAL | Status: DC
  Administered 2016-10-20 – 2016-10-24 (×7): 15 mL via OROMUCOSAL
  Filled 2016-10-20 (×7): qty 15

## 2016-10-20 NOTE — Progress Notes (Signed)
PROGRESS NOTE  Kendra Ortega XNA:[id number] DOB: [birth year] DOA: 10/19/2016 PCP: Javier Glazier, MD  HPI/Recap of past 24 hours:  Significant ng output, she reports passing gas, ab pain has improved after ng suction restarted No fever, last bm yesterday am Husband at bedside  Assessment/Plan: Principal Problem:   Small bowel obstruction ([place]) Active Problems:   SBO (small bowel obstruction) ( [place])   Type 2 diabetes mellitus without complication (HCC)   Hypothyroidism   IBS (irritable bowel syndrome)   History of breast cancer   History of uterine cancer  SBO ----NPO --NG tube to LIS --Anti-emetics and analgesics as needed --NS at 100cc/hr.  May need to add D5 if she becomes hypoglycemic. --General Surgery consult appreciated  Noninsulin dependent Type 2 DM -check a1c , home med amaryl held  --SSI q4h if needed while NPO, low dose  Hypothyroidism --Will give half home dose via IV while NPO Check tsh   DVT prophylaxis: Lovenox Code Status: FULL Family Communication: Husband present at the bedside  Disposition Plan: pending clinical improvement,  Consults called: General Surgery    Procedures:  Ng suction  Antibiotics:  none   Objective: BP (!) 149/71 (BP Location: Left Arm)   Pulse 79   Temp 98.3 F (36.8 C) (Oral)   Resp 20   Ht 5' 4.5" (1.638 m)   Wt 73.5 kg (162 lb)   SpO2 96%   BMI 27.38 kg/m   Intake/Output Summary (Last 24 hours) at 10/20/16 0756 Last data filed at 10/20/16 0749  Gross per 24 hour  Intake           886.67 ml  Output             1350 ml  Net          -463.33 ml   Filed Weights   10/19/16 1255  Weight: 73.5 kg (162 lb)    Exam:   General:  NAD, on ng suction with significant output  Cardiovascular: RRR  Respiratory: CTABL  Abdomen: distended, nontender, positive BS  Musculoskeletal: trace bilateral ankle Edema (report chronic)  Neuro: aaox3  Data Reviewed: Basic Metabolic Panel:  Recent Labs Lab  10/19/16 1400  NA 135  K 4.1  CL 98*  CO2 27  GLUCOSE 110*  BUN 14  CREATININE 0.72  CALCIUM 9.5   Liver Function Tests:  Recent Labs Lab 10/19/16 1400  AST 23  ALT 16  ALKPHOS 86  BILITOT 0.6  PROT 7.5  ALBUMIN 4.3    Recent Labs Lab 10/19/16 1400  LIPASE 26   No results for input(s): AMMONIA in the last 168 hours. CBC:  Recent Labs Lab 10/19/16 1400  WBC 11.0*  NEUTROABS 8.0*  HGB 12.8  HCT 37.9  MCV 100.3*  PLT 282   Cardiac Enzymes:   No results for input(s): CKTOTAL, CKMB, CKMBINDEX, TROPONINI in the last 168 hours. BNP (last 3 results) No results for input(s): BNP in the last 8760 hours.  ProBNP (last 3 results) No results for input(s): PROBNP in the last 8760 hours.  CBG:  Recent Labs Lab 10/19/16 2233 10/20/16 0450 10/20/16 0741  GLUCAP 150* 90 117*    No results found for this or any previous visit (from the past 240 hour(s)).   Studies: Ct Abdomen Pelvis W Contrast  Addendum Date: 10/19/2016   ADDENDUM REPORT: 10/19/2016 16:38 ADDENDUM: Voice recognition error in the first sentence of the first impression which should read Evidence for small bowel obstruction in the mid to  distal small bowel. Electronically Signed   By: Rolm Baptise M.D.   On: 10/19/2016 16:38   Result Date: 10/19/2016 CLINICAL DATA:  Upper abdominal pain.  History of bowel obstruction. EXAM: CT ABDOMEN AND PELVIS WITH CONTRAST TECHNIQUE: Multidetector CT imaging of the abdomen and pelvis was performed using the standard protocol following bolus administration of intravenous contrast. CONTRAST:  136mL ISOVUE-300 IOPAMIDOL (ISOVUE-300) INJECTION 61% COMPARISON:  None. FINDINGS: Lower chest: Linear scarring in the lung bases. No effusions. Heart is normal size. Hepatobiliary: No focal hepatic abnormality. Gallbladder unremarkable. Pancreas: No focal abnormality or ductal dilatation. Spleen: No focal abnormality.  Normal size. Adrenals/Urinary Tract: Small low-density nodule  in the right adrenal gland, difficult to characterize on this contrast-enhanced study, but likely a small adenoma. This measures 15 mm. Left adrenal gland and kidneys are unremarkable. Urinary bladder unremarkable. Stomach/Bowel: Dilated proximal and mid small bowel loops. Distal small bowel loops are decompressed. Findings compatible with small bowel obstruction. Exact transition and cause not identified. Vascular/Lymphatic: Aortic and iliac calcifications. No aneurysm or adenopathy. Reproductive: Prior hysterectomy.  No adnexal masses. Other: Trace free fluid in the pelvis.  No free air. Musculoskeletal: No acute bony abnormality. IMPRESSION: No evidence for small bowel obstruction in the mid to distal small bowel. Exact transition and cause not visualized. Prior hysterectomy. Aortoiliac atherosclerosis. Electronically Signed: By: Rolm Baptise M.D. On: 10/19/2016 16:23    Scheduled Meds: . enoxaparin (LOVENOX) injection  40 mg Subcutaneous Q24H  . insulin aspart  0-9 Units Subcutaneous Q4H  . levothyroxine  25 mcg Intravenous Daily    Continuous Infusions: . sodium chloride 100 mL/hr at 10/20/16 0750     Time spent: 53mins  Cartina Brousseau MD, PhD  [facility] Pager [phone]. If 7PM-7AM, please contact night-coverage at [email or web address], password [facility] 10/20/2016, 7:56 AM  LOS: 0 days

## 2016-10-20 NOTE — Progress Notes (Signed)
Patient ID: Kendra Ortega, female   DOB: 1931/07/26, 83 y.o.   MRN: [id number] [facility] Progress Note:   * No surgery found *  Subjective: Mental status is clear and very pleasant Objective: Vital signs in last 24 hours: Temp:  [97.6 F (36.4 C)-98.3 F (36.8 C)] 98.3 F (36.8 C) (07/01 0450) Pulse Rate:  [63-91] 79 (07/01 0450) Resp:  [16-20] 20 (07/01 0450) BP: (139-186)/(69-96) 149/71 (07/01 0450) SpO2:  [95 %-100 %] 96 % (07/01 0450) Weight:  [73.5 kg (162 lb)] 73.5 kg (162 lb) (06/30 1255)  Intake/Output from previous day: 06/30 0701 - 07/01 0700 In: 886.7 [I.V.:826.7; NG/GT:60] Out: 1150 [Urine:200; Emesis/NG output:950] Intake/Output this shift: Total I/O In: 0  Out: 200 [Urine:200]  Physical Exam: Work of breathing is not labored.  [place] full;  Patient feeling much better and says she passed flatus.    Lab Results:  Results for orders placed or performed during the hospital encounter of 10/19/16 (from the past 48 hour(s))  CBC with Differential     Status: Abnormal   Collection Time: 10/19/16  2:00 PM  Result Value Ref Range   WBC 11.0 (H) 4.0 - 10.5 K/uL   RBC 3.78 (L) 3.87 - 5.11 MIL/uL   Hemoglobin 12.8 12.0 - 15.0 g/dL   HCT 37.9 36.0 - 46.0 %   MCV 100.3 (H) 78.0 - 100.0 fL   MCH 33.9 26.0 - 34.0 pg   MCHC 33.8 30.0 - 36.0 g/dL   RDW 11.9 11.5 - 15.5 %   Platelets 282 150 - 400 K/uL   Neutrophils Relative % 73 %   Neutro Abs 8.0 (H) 1.7 - 7.7 K/uL   Lymphocytes Relative 16 %   Lymphs Abs 1.8 0.7 - 4.0 K/uL   Monocytes Relative 10 %   Monocytes Absolute 1.1 (H) 0.1 - 1.0 K/uL   Eosinophils Relative 1 %   Eosinophils Absolute 0.1 0.0 - 0.7 K/uL   Basophils Relative 0 %   Basophils Absolute 0.0 0.0 - 0.1 K/uL  Comprehensive metabolic panel     Status: Abnormal   Collection Time: 10/19/16  2:00 PM  Result Value Ref Range   Sodium 135 135 - 145 mmol/L   Potassium 4.1 3.5 - 5.1 mmol/L   Chloride 98 (L) 101 - 111 mmol/L   CO2 27 22 - 32  mmol/L   Glucose, Bld 110 (H) 65 - 99 mg/dL   BUN 14 6 - 20 mg/dL   Creatinine, Ser 0.72 0.44 - 1.00 mg/dL   Calcium 9.5 8.9 - 10.3 mg/dL   Total Protein 7.5 6.5 - 8.1 g/dL   Albumin 4.3 3.5 - 5.0 g/dL   AST 23 15 - 41 U/L   ALT 16 14 - 54 U/L   Alkaline Phosphatase 86 38 - 126 U/L   Total Bilirubin 0.6 0.3 - 1.2 mg/dL   GFR calc non Af Amer >60 >60 mL/min   GFR calc Af Amer >60 >60 mL/min    Comment: (NOTE) The eGFR has been calculated using the CKD EPI equation. This calculation has not been validated in all clinical situations. eGFR's persistently <60 mL/min signify possible Chronic Kidney Disease.    Anion gap 10 5 - 15  Lipase, blood     Status: None   Collection Time: 10/19/16  2:00 PM  Result Value Ref Range   Lipase 26 11 - 51 U/L  Urinalysis, Routine w reflex microscopic     Status: Abnormal   Collection Time: 10/19/16  5:15 PM  Result Value Ref Range   Color, Urine YELLOW YELLOW   APPearance CLEAR CLEAR   Specific Gravity, Urine 1.045 (H) 1.005 - 1.030   pH 7.5 5.0 - 8.0   Glucose, UA NEGATIVE NEGATIVE mg/dL   Hgb urine dipstick NEGATIVE NEGATIVE   Bilirubin Urine NEGATIVE NEGATIVE   Ketones, ur NEGATIVE NEGATIVE mg/dL   Protein, ur NEGATIVE NEGATIVE mg/dL   Nitrite NEGATIVE NEGATIVE   Leukocytes, UA MODERATE (A) NEGATIVE  Urinalysis, Microscopic (reflex)     Status: Abnormal   Collection Time: 10/19/16  5:15 PM  Result Value Ref Range   RBC / HPF 0-5 0 - 5 RBC/hpf   WBC, UA 6-30 0 - 5 WBC/hpf   Bacteria, UA MANY (A) NONE SEEN   Squamous Epithelial / LPF 0-5 (A) NONE SEEN  Glucose, capillary     Status: Abnormal   Collection Time: 10/19/16 10:33 PM  Result Value Ref Range   Glucose-Capillary 150 (H) 65 - 99 mg/dL  APTT     Status: None   Collection Time: 10/20/16  4:37 AM  Result Value Ref Range   aPTT 28 24 - 36 seconds  Protime-INR     Status: None   Collection Time: 10/20/16  4:37 AM  Result Value Ref Range   Prothrombin Time 13.2 11.4 - 15.2  seconds   INR 1.00   Glucose, capillary     Status: None   Collection Time: 10/20/16  4:50 AM  Result Value Ref Range   Glucose-Capillary 90 65 - 99 mg/dL  Glucose, capillary     Status: Abnormal   Collection Time: 10/20/16  7:41 AM  Result Value Ref Range   Glucose-Capillary 117 (H) 65 - 99 mg/dL    Radiology/Results: Ct Abdomen Pelvis W Contrast  Addendum Date: 10/19/2016   ADDENDUM REPORT: 10/19/2016 16:38 ADDENDUM: Voice recognition error in the first sentence of the first impression which should read Evidence for small bowel obstruction in the mid to distal small bowel. Electronically Signed   By: Rolm Baptise M.D.   On: 10/19/2016 16:38   Result Date: 10/19/2016 CLINICAL DATA:  Upper abdominal pain.  History of bowel obstruction. EXAM: CT ABDOMEN AND PELVIS WITH CONTRAST TECHNIQUE: Multidetector CT imaging of the abdomen and pelvis was performed using the standard protocol following bolus administration of intravenous contrast. CONTRAST:  143m ISOVUE-300 IOPAMIDOL (ISOVUE-300) INJECTION 61% COMPARISON:  None. FINDINGS: Lower chest: Linear scarring in the lung bases. No effusions. Heart is normal size. Hepatobiliary: No focal hepatic abnormality. Gallbladder unremarkable. Pancreas: No focal abnormality or ductal dilatation. Spleen: No focal abnormality.  Normal size. Adrenals/Urinary Tract: Small low-density nodule in the right adrenal gland, difficult to characterize on this contrast-enhanced study, but likely a small adenoma. This measures 15 mm. Left adrenal gland and kidneys are unremarkable. Urinary bladder unremarkable. Stomach/Bowel: Dilated proximal and mid small bowel loops. Distal small bowel loops are decompressed. Findings compatible with small bowel obstruction. Exact transition and cause not identified. Vascular/Lymphatic: Aortic and iliac calcifications. No aneurysm or adenopathy. Reproductive: Prior hysterectomy.  No adnexal masses. Other: Trace free fluid in the pelvis.  No  free air. Musculoskeletal: No acute bony abnormality. IMPRESSION: No evidence for small bowel obstruction in the mid to distal small bowel. Exact transition and cause not visualized. Prior hysterectomy. Aortoiliac atherosclerosis. Electronically Signed: By: KRolm BaptiseM.D. On: 10/19/2016 16:23    Anti-infectives: Anti-infectives    None      Assessment/Plan: Problem List: Patient Active Problem List  Diagnosis Date Noted  . Small bowel obstruction ([place]) 10/19/2016  . SBO (small bowel obstruction) ([place]) 10/19/2016  . Type 2 diabetes mellitus without complication ([place]) 72/82/0601  . Hypothyroidism 10/19/2016  . IBS (irritable bowel syndrome) 10/19/2016  . History of breast cancer 10/19/2016  . History of uterine cancer 10/19/2016    Will check small bowel obstruction protocol.  Continue NG until clear signs of resolution.  Abdomen nontender at present * No surgery found *    LOS: 0 days   Matt B. Hassell Done, MD, [facility] [phone] beeper [phone]  10/20/2016 10:07 AM

## 2016-10-20 NOTE — Progress Notes (Signed)
   Subjective/Chief Complaint: Still hurting   Objective: Vital signs in last 24 hours: Temp:  [97.6 F (36.4 C)-98.3 F (36.8 C)] 98.3 F (36.8 C) (07/01 0450) Pulse Rate:  [63-91] 79 (07/01 0450) Resp:  [16-20] 20 (07/01 0450) BP: (139-186)/(69-96) 149/71 (07/01 0450) SpO2:  [95 %-100 %] 96 % (07/01 0450) Weight:  [73.5 kg (162 lb)] 73.5 kg (162 lb) (06/30 1255) Last BM Date: 10/19/16  Intake/Output from previous day: 06/30 0701 - 07/01 0700 In: 886.7 [I.V.:826.7; NG/GT:60] Out: 1150 [Urine:200; Emesis/NG output:950] Intake/Output this shift: Total I/O In: 0  Out: 200 [Urine:200]  General appearance: alert, cooperative and distracted Resp: clear to auscultation bilaterally Cardio: regular rate and rhythm GI: soft, moderate right sided tenderness  Lab Results:   Recent Labs  10/19/16 1400  WBC 11.0*  HGB 12.8  HCT 37.9  PLT 282   BMET  Recent Labs  10/19/16 1400  NA 135  K 4.1  CL 98*  CO2 27  GLUCOSE 110*  BUN 14  CREATININE 0.72  CALCIUM 9.5   PT/INR  Recent Labs  10/20/16 0437  LABPROT 13.2  INR 1.00   ABG No results for input(s): PHART, HCO3 in the last 72 hours.  Invalid input(s): PCO2, PO2  Studies/Results: Ct Abdomen Pelvis W Contrast  Addendum Date: 10/19/2016   ADDENDUM REPORT: 10/19/2016 16:38 ADDENDUM: Voice recognition error in the first sentence of the first impression which should read Evidence for small bowel obstruction in the mid to distal small bowel. Electronically Signed   By: Rolm Baptise M.D.   On: 10/19/2016 16:38   Result Date: 10/19/2016 CLINICAL DATA:  Upper abdominal pain.  History of bowel obstruction. EXAM: CT ABDOMEN AND PELVIS WITH CONTRAST TECHNIQUE: Multidetector CT imaging of the abdomen and pelvis was performed using the standard protocol following bolus administration of intravenous contrast. CONTRAST:  145mL ISOVUE-300 IOPAMIDOL (ISOVUE-300) INJECTION 61% COMPARISON:  None. FINDINGS: Lower chest: Linear  scarring in the lung bases. No effusions. Heart is normal size. Hepatobiliary: No focal hepatic abnormality. Gallbladder unremarkable. Pancreas: No focal abnormality or ductal dilatation. Spleen: No focal abnormality.  Normal size. Adrenals/Urinary Tract: Small low-density nodule in the right adrenal gland, difficult to characterize on this contrast-enhanced study, but likely a small adenoma. This measures 15 mm. Left adrenal gland and kidneys are unremarkable. Urinary bladder unremarkable. Stomach/Bowel: Dilated proximal and mid small bowel loops. Distal small bowel loops are decompressed. Findings compatible with small bowel obstruction. Exact transition and cause not identified. Vascular/Lymphatic: Aortic and iliac calcifications. No aneurysm or adenopathy. Reproductive: Prior hysterectomy.  No adnexal masses. Other: Trace free fluid in the pelvis.  No free air. Musculoskeletal: No acute bony abnormality. IMPRESSION: No evidence for small bowel obstruction in the mid to distal small bowel. Exact transition and cause not visualized. Prior hysterectomy. Aortoiliac atherosclerosis. Electronically Signed: By: Rolm Baptise M.D. On: 10/19/2016 16:23    Anti-infectives: Anti-infectives    None      Assessment/Plan: s/p * No surgery found * plan for lap chole with ioc today. risks and benefits of the surgery as well as some of the technical aspects discussed with the patient and she understands and wishes to proceed  LOS: 0 days    TOTH III,[employer] S 10/20/2016

## 2016-10-21 ENCOUNTER — Inpatient Hospital Stay (HOSPITAL_COMMUNITY): Payer: Medicare Other

## 2016-10-21 LAB — BASIC METABOLIC PANEL
ANION GAP: 11 (ref 5–15)
BUN: 12 mg/dL (ref 6–20)
CALCIUM: 8.9 mg/dL (ref 8.9–10.3)
CO2: 25 mmol/L (ref 22–32)
CREATININE: 0.95 mg/dL (ref 0.44–1.00)
Chloride: 100 mmol/L — ABNORMAL LOW (ref 101–111)
GFR, EST NON AFRICAN AMERICAN: 54 mL/min — AB (ref >60)
GLUCOSE: 141 mg/dL — AB (ref 65–99)
Potassium: 4 mmol/L (ref 3.5–5.1)
Sodium: 136 mmol/L (ref 135–145)

## 2016-10-21 LAB — CBC WITH DIFFERENTIAL/PLATELET
BASOS ABS: 0 10*3/uL (ref 0.0–0.1)
BASOS PCT: 0 %
EOS PCT: 1 %
Eosinophils Absolute: 0.1 10*3/uL (ref 0.0–0.7)
HEMATOCRIT: 39.8 % (ref 36.0–46.0)
Hemoglobin: 13.4 g/dL (ref 12.0–15.0)
Lymphocytes Relative: 16 %
Lymphs Abs: 1.8 10*3/uL (ref 0.7–4.0)
MCH: 33 pg (ref 26.0–34.0)
MCHC: 33.7 g/dL (ref 30.0–36.0)
MCV: 98 fL (ref 78.0–100.0)
MONO ABS: 1.5 10*3/uL — AB (ref 0.1–1.0)
MONOS PCT: 13 %
Neutro Abs: 8.2 10*3/uL — ABNORMAL HIGH (ref 1.7–7.7)
Neutrophils Relative %: 70 %
PLATELETS: 319 10*3/uL (ref 150–400)
RBC: 4.06 MIL/uL (ref 3.87–5.11)
RDW: 12.5 % (ref 11.5–15.5)
WBC: 11.6 10*3/uL — ABNORMAL HIGH (ref 4.0–10.5)

## 2016-10-21 LAB — GLUCOSE, CAPILLARY
GLUCOSE-CAPILLARY: 119 mg/dL — AB (ref 65–99)
GLUCOSE-CAPILLARY: 130 mg/dL — AB (ref 65–99)
GLUCOSE-CAPILLARY: 143 mg/dL — AB (ref 65–99)
Glucose-Capillary: 134 mg/dL — ABNORMAL HIGH (ref 65–99)
Glucose-Capillary: 149 mg/dL — ABNORMAL HIGH (ref 65–99)
Glucose-Capillary: 142 mg/dL — ABNORMAL HIGH (ref 65–99)
Glucose-Capillary: 133 mg/dL — ABNORMAL HIGH (ref 65–99)

## 2016-10-21 LAB — TSH: TSH: 6.523 u[IU]/mL — ABNORMAL HIGH (ref 0.350–4.500)

## 2016-10-21 LAB — MAGNESIUM: Magnesium: 2.2 mg/dL (ref 1.7–2.4)

## 2016-10-21 MED ORDER — PHENOL 1.4 % MT LIQD
1.0000 | OROMUCOSAL | Status: DC | PRN
  Administered 2016-10-21: 1 via OROMUCOSAL
  Filled 2016-10-21: qty 177

## 2016-10-21 NOTE — Progress Notes (Signed)
PROGRESS NOTE  Kendra Ortega PJK:[id number] DOB: Sep 10, 1931 DOA: 10/19/2016 PCP: Javier Glazier, MD  HPI/Recap of past 24 hours:  on ng suction, not passing gas, ab pain has improved, but has not pass gas, no fever, she has ambulated several time in the hallway  Assessment/Plan: Principal Problem:   Small bowel obstruction ([place]) Active Problems:   SBO (small bowel obstruction) ([place])   Type 2 diabetes mellitus without complication (HCC)   Hypothyroidism   IBS (irritable bowel syndrome)   History of breast cancer   History of uterine cancer  SBO ----NPO --NG tube to LIS --Anti-emetics and analgesics as needed --NS at 100cc/hr.  May need to add D5 if she becomes hypoglycemic. --repeat kub this am, persistent sbo, General Surgery consult appreciated  Noninsulin dependent Type 2 DM -check a1c , home med amaryl held  --SSI q4h if needed while NPO, low dose  Hypothyroidism --Will give half home dose via IV while NPO tsh 6.5 will need to increase home synthroid dose at discharge.  DVT prophylaxis: Lovenox Code Status: FULL Family Communication: patient  Disposition Plan: pending clinical improvement,  Consults called: General Surgery    Procedures:  Ng suction  Antibiotics:  none   Objective: BP 132/66 (BP Location: Left Arm)   Pulse 81   Temp 98.9 F (37.2 C) (Oral)   Resp 18   Ht 5' 4.5" (1.638 m)   Wt 73.5 kg (162 lb)   SpO2 99%   BMI 27.38 kg/m   Intake/Output Summary (Last 24 hours) at 10/21/16 1924 Last data filed at 10/21/16 1400  Gross per 24 hour  Intake          2058.33 ml  Output             1850 ml  Net           208.33 ml   Filed Weights   10/19/16 1255  Weight: 73.5 kg (162 lb)    Exam:   General:  NAD, on ng suction with significant output  Cardiovascular: RRR  Respiratory: CTABL  Abdomen: distended, nontender, positive BS  Musculoskeletal: trace bilateral ankle Edema (report chronic)  Neuro: aaox3  Data  Reviewed: Basic Metabolic Panel:  Recent Labs Lab 10/19/16 1400 10/21/16 0421  NA 135 136  K 4.1 4.0  CL 98* 100*  CO2 27 25  GLUCOSE 110* 141*  BUN 14 12  CREATININE 0.72 0.95  CALCIUM 9.5 8.9  MG  --  2.2   Liver Function Tests:  Recent Labs Lab 10/19/16 1400  AST 23  ALT 16  ALKPHOS 86  BILITOT 0.6  PROT 7.5  ALBUMIN 4.3    Recent Labs Lab 10/19/16 1400  LIPASE 26   No results for input(s): AMMONIA in the last 168 hours. CBC:  Recent Labs Lab 10/19/16 1400 10/21/16 0421  WBC 11.0* 11.6*  NEUTROABS 8.0* 8.2*  HGB 12.8 13.4  HCT 37.9 39.8  MCV 100.3* 98.0  PLT 282 319   Cardiac Enzymes:   No results for input(s): CKTOTAL, CKMB, CKMBINDEX, TROPONINI in the last 168 hours. BNP (last 3 results) No results for input(s): BNP in the last 8760 hours.  ProBNP (last 3 results) No results for input(s): PROBNP in the last 8760 hours.  CBG:  Recent Labs Lab 10/21/16 0003 10/21/16 0408 10/21/16 0734 10/21/16 1203 10/21/16 1657  GLUCAP 119* 134* 149* 143* 130*    No results found for this or any previous visit (from the past 240 hour(s)).  Studies: Dg Abd Portable 1v  Result Date: 10/21/2016 CLINICAL DATA:  Follow-up small-bowel obstruction EXAM: PORTABLE ABDOMEN - 1 VIEW COMPARISON:  10/20/2016.  CT 10/19/2016 . FINDINGS: NG tube noted coiled in the stomach. Persistent dilated loops of small bowel noted without interim change. Stool noted again in the colon. Oral contrast not identified, possibly secondary to dilution. No free air. No acute bony abnormality. IMPRESSION: NG tube noted coiled stomach. Persistent dilated loops of small bowel are again noted without significant interim change. Stool is noted colon. No free air. Electronically Signed   By: Marcello Moores  Register   On: 10/21/2016 10:42    Scheduled Meds: . chlorhexidine  15 mL Mouth Rinse BID  . enoxaparin (LOVENOX) injection  40 mg Subcutaneous Q24H  . insulin aspart  0-9 Units Subcutaneous  Q4H  . levothyroxine  25 mcg Intravenous Daily  . mouth rinse  15 mL Mouth Rinse q12n4p    Continuous Infusions: . sodium chloride 100 mL/hr at 10/21/16 0302     Time spent: 44mins  Shanna Un MD, PhD  [facility] Pager [phone]. If 7PM-7AM, please contact night-coverage at [email or web address], password [facility] 10/21/2016, 7:24 PM  LOS: 1 day

## 2016-10-21 NOTE — Progress Notes (Signed)
Patient ID: Kendra Ortega, female   DOB: 11-21-1931, 83 y.o.   MRN: [id number]  [facility] Surgery Progress Note     Subjective: CC- SBO Sitting up in bed. Patient reports less abdominal pain than yesterday, although she does still feel bloated. Passed gas once yesterday, no flatus or BM today. Denies n/v. She has ambulated several times around the unit.   Objective: Vital signs in last 24 hours: Temp:  [98.3 F (36.8 C)-98.5 F (36.9 C)] 98.5 F (36.9 C) (07/02 0415) Pulse Rate:  [76-95] 85 (07/02 0415) Resp:  [18] 18 (07/02 0415) BP: (155-175)/(64-74) 155/74 (07/02 0415) SpO2:  [93 %-98 %] 96 % (07/02 0415) Last BM Date: 10/19/16  Intake/Output from previous day: 07/01 0701 - 07/02 0700 In: 2400 [I.V.:2400] Out: 4300 [Urine:900; Emesis/NG output:3400] Intake/Output this shift: No intake/output data recorded.  PE: Gen:  Alert, NAD, pleasant HEENT: EOM's intact, pupils equal  Card:  RRR, no M/G/R heard Pulm:  CTAB, no W/R/R, effort normal Abd: well healed midline incision, soft, distended, mild lower abdominal tenderness, no BS Ext:  No erythema, edema, or tenderness BUE/BLE  Psych: A&Ox3  Skin: no rashes noted, warm and dry  Lab Results:   Recent Labs  10/19/16 1400 10/21/16 0421  WBC 11.0* 11.6*  HGB 12.8 13.4  HCT 37.9 39.8  PLT 282 319   BMET  Recent Labs  10/19/16 1400 10/21/16 0421  NA 135 136  K 4.1 4.0  CL 98* 100*  CO2 27 25  GLUCOSE 110* 141*  BUN 14 12  CREATININE 0.72 0.95  CALCIUM 9.5 8.9   PT/INR  Recent Labs  10/20/16 0437  LABPROT 13.2  INR 1.00   CMP     Component Value Date/Time   NA 136 10/21/2016 0421   K 4.0 10/21/2016 0421   CL 100 (L) 10/21/2016 0421   CO2 25 10/21/2016 0421   GLUCOSE 141 (H) 10/21/2016 0421   BUN 12 10/21/2016 0421   CREATININE 0.95 10/21/2016 0421   CALCIUM 8.9 10/21/2016 0421   PROT 7.5 10/19/2016 1400   ALBUMIN 4.3 10/19/2016 1400   AST 23 10/19/2016 1400   ALT 16 10/19/2016 1400    ALKPHOS 86 10/19/2016 1400   BILITOT 0.6 10/19/2016 1400   GFRNONAA 54 (L) 10/21/2016 0421   GFRAA >60 10/21/2016 0421   Lipase     Component Value Date/Time   LIPASE 26 10/19/2016 1400       Studies/Results: Ct Abdomen Pelvis W Contrast  Addendum Date: 10/19/2016   ADDENDUM REPORT: 10/19/2016 16:38 ADDENDUM: Voice recognition error in the first sentence of the first impression which should read Evidence for small bowel obstruction in the mid to distal small bowel. Electronically Signed   By: Rolm Baptise M.D.   On: 10/19/2016 16:38   Result Date: 10/19/2016 CLINICAL DATA:  Upper abdominal pain.  History of bowel obstruction. EXAM: CT ABDOMEN AND PELVIS WITH CONTRAST TECHNIQUE: Multidetector CT imaging of the abdomen and pelvis was performed using the standard protocol following bolus administration of intravenous contrast. CONTRAST:  167mL ISOVUE-300 IOPAMIDOL (ISOVUE-300) INJECTION 61% COMPARISON:  None. FINDINGS: Lower chest: Linear scarring in the lung bases. No effusions. Heart is normal size. Hepatobiliary: No focal hepatic abnormality. Gallbladder unremarkable. Pancreas: No focal abnormality or ductal dilatation. Spleen: No focal abnormality.  Normal size. Adrenals/Urinary Tract: Small low-density nodule in the right adrenal gland, difficult to characterize on this contrast-enhanced study, but likely a small adenoma. This measures 15 mm. Left adrenal gland and kidneys are  unremarkable. Urinary bladder unremarkable. Stomach/Bowel: Dilated proximal and mid small bowel loops. Distal small bowel loops are decompressed. Findings compatible with small bowel obstruction. Exact transition and cause not identified. Vascular/Lymphatic: Aortic and iliac calcifications. No aneurysm or adenopathy. Reproductive: Prior hysterectomy.  No adnexal masses. Other: Trace free fluid in the pelvis.  No free air. Musculoskeletal: No acute bony abnormality. IMPRESSION: No evidence for small bowel obstruction in the  mid to distal small bowel. Exact transition and cause not visualized. Prior hysterectomy. Aortoiliac atherosclerosis. Electronically Signed: By: Rolm Baptise M.D. On: 10/19/2016 16:23   Dg Abd Portable 1v-small Bowel Obstruction Protocol-initial, 8 Hr Delay  Result Date: 10/20/2016 CLINICAL DATA:  83 year old female 8 hour delayed status post enteric contrast by NG tube. EXAM: PORTABLE ABDOMEN - 1 VIEW COMPARISON:  0958 hours today.  CT Abdomen and Pelvis 10/19/2016. FINDINGS: AP view of the abdomen and pelvis at 1848 hours. Stable enteric tube, side hole at the gastric fundus level. There is minimal flocculated contrast visible in the stomach and left upper quadrant small bowel, probably the proximal jejunum just beyond the ligament of Treitz. There does appear to be diluted contrast in the dilated left abdominal small bowel loops (arrows). No distal small bowel or large bowel contrast identified. There does appear to be excreted IV contrast in the urinary bladder. Stable lung bases. No acute osseous abnormality identified. Aortoiliac calcified atherosclerosis. IMPRESSION: 1. No enteric contrast has reached the large bowel. I suspect most of the contrast has been diluted in the fluid-filled small bowel loops in the left abdomen. There is minimal residual flocculated contrast in the stomach and at the ligament of Treitz. 2. Stable NG tube. Electronically Signed   By: Genevie Ann M.D.   On: 10/20/2016 19:13   Dg Abd Portable 1v-small Bowel Protocol-position Verification  Result Date: 10/20/2016 CLINICAL DATA:  Nasogastric placement. EXAM: PORTABLE ABDOMEN - 1 VIEW COMPARISON:  None. FINDINGS: Nasogastric tube enters the stomach in has its tip in the fundus. IMPRESSION: Nasogastric tube enters the stomach in has its tip in the fundus. Electronically Signed   By: Nelson Chimes M.D.   On: 10/20/2016 11:11    Anti-infectives: Anti-infectives    None       Assessment/Plan DM Hypothyroidism  SBO - abdominal  surgical history: appendectomy, hysterectomy, ex lap for SBO about 20 years ago - CT 6/30 showed evidence for small bowel obstruction in the mid to distal small bowel - XR yesterday showed contrast in the colon - NG tube with high output. No flatus or BM  ID - none VTE - lovenox FEN - IVF, NPO/NGT  Plan - repeat abdominal Xray today. Continue NPO/NGT. Continue ambulating.   LOS: 1 day    Jerrye Beavers , [facility] Surgery 10/21/2016, 8:54 AM Pager: [phone] Consults: [phone] Mon-Fri 7:00 am-4:30 pm Sat-Sun 7:00 am-11:30 am

## 2016-10-21 NOTE — Progress Notes (Signed)
Pt is from [place] [facility]. CSW will assist with d/c planning if a higher level of care is needed at d/c.  Werner Lean LCSW [phone]

## 2016-10-22 LAB — GLUCOSE, CAPILLARY
GLUCOSE-CAPILLARY: 113 mg/dL — AB (ref 65–99)
GLUCOSE-CAPILLARY: 124 mg/dL — AB (ref 65–99)
GLUCOSE-CAPILLARY: 92 mg/dL (ref 65–99)
Glucose-Capillary: 120 mg/dL — ABNORMAL HIGH (ref 65–99)
Glucose-Capillary: 121 mg/dL — ABNORMAL HIGH (ref 65–99)
Glucose-Capillary: 126 mg/dL — ABNORMAL HIGH (ref 65–99)
Glucose-Capillary: 130 mg/dL — ABNORMAL HIGH (ref 65–99)

## 2016-10-22 LAB — CBC WITH DIFFERENTIAL/PLATELET
BASOS ABS: 0 10*3/uL (ref 0.0–0.1)
BASOS PCT: 0 %
EOS PCT: 1 %
Eosinophils Absolute: 0.1 10*3/uL (ref 0.0–0.7)
HCT: 39.2 % (ref 36.0–46.0)
Hemoglobin: 13.1 g/dL (ref 12.0–15.0)
Lymphocytes Relative: 18 %
Lymphs Abs: 2 10*3/uL (ref 0.7–4.0)
MCH: 33.1 pg (ref 26.0–34.0)
MCHC: 33.4 g/dL (ref 30.0–36.0)
MCV: 99 fL (ref 78.0–100.0)
MONO ABS: 1.4 10*3/uL — AB (ref 0.1–1.0)
MONOS PCT: 12 %
Neutro Abs: 7.9 10*3/uL — ABNORMAL HIGH (ref 1.7–7.7)
Neutrophils Relative %: 69 %
PLATELETS: 306 10*3/uL (ref 150–400)
RBC: 3.96 MIL/uL (ref 3.87–5.11)
RDW: 12.5 % (ref 11.5–15.5)
WBC: 11.4 10*3/uL — ABNORMAL HIGH (ref 4.0–10.5)

## 2016-10-22 LAB — BASIC METABOLIC PANEL
Anion gap: 9 (ref 5–15)
BUN: 17 mg/dL (ref 6–20)
CALCIUM: 9 mg/dL (ref 8.9–10.3)
CO2: 30 mmol/L (ref 22–32)
CREATININE: 0.85 mg/dL (ref 0.44–1.00)
Chloride: 99 mmol/L — ABNORMAL LOW (ref 101–111)
GLUCOSE: 129 mg/dL — AB (ref 65–99)
Potassium: 3.4 mmol/L — ABNORMAL LOW (ref 3.5–5.1)
Sodium: 138 mmol/L (ref 135–145)

## 2016-10-22 LAB — URINE CULTURE

## 2016-10-22 LAB — HEMOGLOBIN A1C
HEMOGLOBIN A1C: 6.4 % — AB (ref 4.8–5.6)
Mean Plasma Glucose: 137 mg/dL

## 2016-10-22 MED ORDER — POTASSIUM CHLORIDE 10 MEQ/100ML IV SOLN
10.0000 meq | INTRAVENOUS | Status: AC
  Administered 2016-10-22 (×2): 10 meq via INTRAVENOUS
  Filled 2016-10-22 (×2): qty 100

## 2016-10-22 MED ORDER — POTASSIUM CHLORIDE 10 MEQ/100ML IV SOLN
10.0000 meq | INTRAVENOUS | Status: AC
  Administered 2016-10-22 (×2): 10 meq via INTRAVENOUS
  Filled 2016-10-22 (×4): qty 100

## 2016-10-22 NOTE — Progress Notes (Signed)
NG tube fell out, called to PA, states to leave NG out for now, if nauseated or vomits, replace

## 2016-10-22 NOTE — Progress Notes (Signed)
Patient Bp 174/81 by dinamap Repeated manually and is Bp 165/80, paged on cal provider. We will continue to monitor.

## 2016-10-22 NOTE — Progress Notes (Signed)
PROGRESS NOTE  Kendra Ortega IWP:[id number] DOB: 1931-08-21 DOA: 10/19/2016 PCP: Javier Glazier, MD  HPI/Recap of past 24 hours:   NG  felt out She report start to pass gas, ab pain has much improved, no bm, no n/v, no fever she has ambulated several times in the hallway  Assessment/Plan: Principal Problem:   Small bowel obstruction ([place]) Active Problems:   SBO (small bowel obstruction) ([place])   Type 2 diabetes mellitus without complication (HCC)   Hypothyroidism   IBS (irritable bowel syndrome)   History of breast cancer   History of uterine cancer  SBO ----NG tube to LIS started on admission, NG fell out on 7/3, did not reinsert , seems improving, remain npo, on ivf- ----NS at 100cc/hr.  May need to add D5 if she becomes hypoglycemic. --repeat kub this am, General Surgery consult appreciated  Hypokalemia:  Iv potassium supplement, repeat lab in am, keep K>4.  Noninsulin dependent Type 2 DM -a1c 6.4 , home med amaryl held  --SSI q4h if needed while NPO, low dose  Hypothyroidism --Will give half home dose via IV while NPO tsh 6.5 will need to increase home synthroid dose at discharge.  DVT prophylaxis: Lovenox Code Status: FULL Family Communication: patient  Disposition Plan: pending clinical improvement,  Consults called: General Surgery    Procedures:  Ng suction from admission to 7/3  Antibiotics:  none   Objective: BP (!) 147/73 (BP Location: Left Arm)   Pulse 91   Temp 98 F (36.7 C) (Oral)   Resp 18   Ht 5' 4.5" (1.638 m)   Wt 73.5 kg (162 lb)   SpO2 93%   BMI 27.38 kg/m   Intake/Output Summary (Last 24 hours) at 10/22/16 0833 Last data filed at 10/22/16 0653  Gross per 24 hour  Intake             2640 ml  Output             3050 ml  Net             -410 ml   Filed Weights   10/19/16 1255  Weight: 73.5 kg (162 lb)    Exam:   General:  NAD, very pleasant  Cardiovascular: RRR  Respiratory: CTABL  Abdomen: less  distended, softer today, nontender, positive BS  Musculoskeletal: trace bilateral ankle Edema (report chronic)  Neuro: aaox3  Data Reviewed: Basic Metabolic Panel:  Recent Labs Lab 10/19/16 1400 10/21/16 0421 10/22/16 0435  NA 135 136 138  K 4.1 4.0 3.4*  CL 98* 100* 99*  CO2 27 25 30   GLUCOSE 110* 141* 129*  BUN 14 12 17   CREATININE 0.72 0.95 0.85  CALCIUM 9.5 8.9 9.0  MG  --  2.2  --    Liver Function Tests:  Recent Labs Lab 10/19/16 1400  AST 23  ALT 16  ALKPHOS 86  BILITOT 0.6  PROT 7.5  ALBUMIN 4.3    Recent Labs Lab 10/19/16 1400  LIPASE 26   No results for input(s): AMMONIA in the last 168 hours. CBC:  Recent Labs Lab 10/19/16 1400 10/21/16 0421 10/22/16 0435  WBC 11.0* 11.6* 11.4*  NEUTROABS 8.0* 8.2* 7.9*  HGB 12.8 13.4 13.1  HCT 37.9 39.8 39.2  MCV 100.3* 98.0 99.0  PLT 282 319 306   Cardiac Enzymes:   No results for input(s): CKTOTAL, CKMB, CKMBINDEX, TROPONINI in the last 168 hours. BNP (last 3 results) No results for input(s): BNP in the last 8760 hours.  ProBNP (last 3 results) No results for input(s): PROBNP in the last 8760 hours.  CBG:  Recent Labs Lab 10/21/16 1657 10/21/16 1948 10/22/16 0008 10/22/16 0411 10/22/16 0745  GLUCAP 130* 133* 130* 120* 113*    No results found for this or any previous visit (from the past 240 hour(s)).   Studies: Dg Abd Portable 1v  Result Date: 10/21/2016 CLINICAL DATA:  Follow-up small-bowel obstruction EXAM: PORTABLE ABDOMEN - 1 VIEW COMPARISON:  10/20/2016.  CT 10/19/2016 . FINDINGS: NG tube noted coiled in the stomach. Persistent dilated loops of small bowel noted without interim change. Stool noted again in the colon. Oral contrast not identified, possibly secondary to dilution. No free air. No acute bony abnormality. IMPRESSION: NG tube noted coiled stomach. Persistent dilated loops of small bowel are again noted without significant interim change. Stool is noted colon. No free air.  Electronically Signed   By: Marcello Moores  Register   On: 10/21/2016 10:42    Scheduled Meds: . chlorhexidine  15 mL Mouth Rinse BID  . enoxaparin (LOVENOX) injection  40 mg Subcutaneous Q24H  . insulin aspart  0-9 Units Subcutaneous Q4H  . levothyroxine  25 mcg Intravenous Daily  . mouth rinse  15 mL Mouth Rinse q12n4p    Continuous Infusions: . sodium chloride 1,000 mL (10/21/16 2340)     Time spent: 52mins  Mellisa Arshad MD, PhD  [facility] Pager [phone]. If 7PM-7AM, please contact night-coverage at [email or web address], password [facility] 10/22/2016, 8:33 AM  LOS: 2 days

## 2016-10-22 NOTE — Progress Notes (Signed)
Patient ID: Kendra Ortega, female   DOB: 1931/10/09, 83 y.o.   MRN: [id number]  [facility] Surgery Progress Note     Subjective: CC- abdominal distension Patient states that her pain is improving today, but she still has not passed any flatus or had a BM. Abdomen still feels distended. She does report feeling more movement in her abdomen today. Denies n/v.  Objective: Vital signs in last 24 hours: Temp:  [98 F (36.7 C)-98.9 F (37.2 C)] 98 F (36.7 C) (07/03 0419) Pulse Rate:  [81-99] 91 (07/03 0419) Resp:  [18] 18 (07/03 0419) BP: (132-174)/(66-81) 147/73 (07/03 0651) SpO2:  [93 %-99 %] 93 % (07/03 0419) Last BM Date: 10/19/16  Intake/Output from previous day: 07/02 0701 - 07/03 0700 In: 2640 [I.V.:2400; NG/GT:240] Out: 3050 [Urine:400; Emesis/NG output:2650] Intake/Output this shift: No intake/output data recorded.  PE: Gen:  Alert, NAD, pleasant HEENT: EOM's intact, pupils equal  Card:  RRR, no M/G/R heard Pulm:  CTAB, no W/R/R, effort normal Abd: well healed midline incision, soft, distended, mild LLQ tenderness, +BS Ext:  No erythema, edema, or tenderness BUE/BLE  Psych: A&Ox3  Skin: no rashes noted, warm and drydry  Lab Results:   Recent Labs  10/21/16 0421 10/22/16 0435  WBC 11.6* 11.4*  HGB 13.4 13.1  HCT 39.8 39.2  PLT 319 306   BMET  Recent Labs  10/21/16 0421 10/22/16 0435  NA 136 138  K 4.0 3.4*  CL 100* 99*  CO2 25 30  GLUCOSE 141* 129*  BUN 12 17  CREATININE 0.95 0.85  CALCIUM 8.9 9.0   PT/INR  Recent Labs  10/20/16 0437  LABPROT 13.2  INR 1.00   CMP     Component Value Date/Time   NA 138 10/22/2016 0435   K 3.4 (L) 10/22/2016 0435   CL 99 (L) 10/22/2016 0435   CO2 30 10/22/2016 0435   GLUCOSE 129 (H) 10/22/2016 0435   BUN 17 10/22/2016 0435   CREATININE 0.85 10/22/2016 0435   CALCIUM 9.0 10/22/2016 0435   PROT 7.5 10/19/2016 1400   ALBUMIN 4.3 10/19/2016 1400   AST 23 10/19/2016 1400   ALT 16 10/19/2016 1400    ALKPHOS 86 10/19/2016 1400   BILITOT 0.6 10/19/2016 1400   GFRNONAA >60 10/22/2016 0435   GFRAA >60 10/22/2016 0435   Lipase     Component Value Date/Time   LIPASE 26 10/19/2016 1400       Studies/Results: Dg Abd Portable 1v  Result Date: 10/21/2016 CLINICAL DATA:  Follow-up small-bowel obstruction EXAM: PORTABLE ABDOMEN - 1 VIEW COMPARISON:  10/20/2016.  CT 10/19/2016 . FINDINGS: NG tube noted coiled in the stomach. Persistent dilated loops of small bowel noted without interim change. Stool noted again in the colon. Oral contrast not identified, possibly secondary to dilution. No free air. No acute bony abnormality. IMPRESSION: NG tube noted coiled stomach. Persistent dilated loops of small bowel are again noted without significant interim change. Stool is noted colon. No free air. Electronically Signed   By: Marcello Moores  Register   On: 10/21/2016 10:42   Dg Abd Portable 1v-small Bowel Obstruction Protocol-initial, 8 Hr Delay  Result Date: 10/20/2016 CLINICAL DATA:  83 year old female 8 hour delayed status post enteric contrast by NG tube. EXAM: PORTABLE ABDOMEN - 1 VIEW COMPARISON:  0958 hours today.  CT Abdomen and Pelvis 10/19/2016. FINDINGS: AP view of the abdomen and pelvis at 1848 hours. Stable enteric tube, side hole at the gastric fundus level. There is minimal flocculated contrast visible  in the stomach and left upper quadrant small bowel, probably the proximal jejunum just beyond the ligament of Treitz. There does appear to be diluted contrast in the dilated left abdominal small bowel loops (arrows). No distal small bowel or large bowel contrast identified. There does appear to be excreted IV contrast in the urinary bladder. Stable lung bases. No acute osseous abnormality identified. Aortoiliac calcified atherosclerosis. IMPRESSION: 1. No enteric contrast has reached the large bowel. I suspect most of the contrast has been diluted in the fluid-filled small bowel loops in the left abdomen.  There is minimal residual flocculated contrast in the stomach and at the ligament of Treitz. 2. Stable NG tube. Electronically Signed   By: Genevie Ann M.D.   On: 10/20/2016 19:13   Dg Abd Portable 1v-small Bowel Protocol-position Verification  Result Date: 10/20/2016 CLINICAL DATA:  Nasogastric placement. EXAM: PORTABLE ABDOMEN - 1 VIEW COMPARISON:  None. FINDINGS: Nasogastric tube enters the stomach in has its tip in the fundus. IMPRESSION: Nasogastric tube enters the stomach in has its tip in the fundus. Electronically Signed   By: Nelson Chimes M.D.   On: 10/20/2016 11:11    Anti-infectives: Anti-infectives    None       Assessment/Plan DM Hypothyroidism  SBO - abdominal surgical history: appendectomy, hysterectomy, ex lap for SBO about 20 years ago - CT 6/30 showed evidence for small bowel obstruction in the mid to distal small bowel - XR yesterday showed stool in colon - NG tube with high output. No flatus or BM, but she does have better BS today  ID - none VTE - lovenox FEN - IVF, NPO/NGT  Plan - Continue NPO/NGT. Continue ambulating. Hopefully she will open up soon.  Addendum: Since this morning patient's NG tube fell out. She has also passed some gas. Will leave NG tube out. If she gets nauseated or vomits it will need to be replaced. Keep NPO and repeat abdominal XR in AM.   LOS: 2 days    Jerrye Beavers , [facility] Surgery 10/22/2016, 9:31 AM Pager: [phone] Consults: [phone] Mon-Fri 7:00 am-4:30 pm Sat-Sun 7:00 am-11:30 am

## 2016-10-23 ENCOUNTER — Inpatient Hospital Stay (HOSPITAL_COMMUNITY): Payer: Medicare Other

## 2016-10-23 DIAGNOSIS — E039 Hypothyroidism, unspecified: Secondary | ICD-10-CM

## 2016-10-23 DIAGNOSIS — E119 Type 2 diabetes mellitus without complications: Secondary | ICD-10-CM

## 2016-10-23 DIAGNOSIS — K56609 Unspecified intestinal obstruction, unspecified as to partial versus complete obstruction: Secondary | ICD-10-CM

## 2016-10-23 LAB — CBC WITH DIFFERENTIAL/PLATELET
BASOS ABS: 0 10*3/uL (ref 0.0–0.1)
BASOS PCT: 0 %
EOS ABS: 0.2 10*3/uL (ref 0.0–0.7)
EOS PCT: 2 %
HCT: 34.3 % — ABNORMAL LOW (ref 36.0–46.0)
Hemoglobin: 11.2 g/dL — ABNORMAL LOW (ref 12.0–15.0)
LYMPHS PCT: 32 %
Lymphs Abs: 2.9 10*3/uL (ref 0.7–4.0)
MCH: 33 pg (ref 26.0–34.0)
MCHC: 32.7 g/dL (ref 30.0–36.0)
MCV: 101.2 fL — AB (ref 78.0–100.0)
MONO ABS: 1.1 10*3/uL — AB (ref 0.1–1.0)
Monocytes Relative: 12 %
Neutro Abs: 5 10*3/uL (ref 1.7–7.7)
Neutrophils Relative %: 54 %
PLATELETS: 269 10*3/uL (ref 150–400)
RBC: 3.39 MIL/uL — AB (ref 3.87–5.11)
RDW: 12.7 % (ref 11.5–15.5)
WBC: 9.2 10*3/uL (ref 4.0–10.5)

## 2016-10-23 LAB — BASIC METABOLIC PANEL
Anion gap: 9 (ref 5–15)
BUN: 21 mg/dL — AB (ref 6–20)
CALCIUM: 8.3 mg/dL — AB (ref 8.9–10.3)
CO2: 24 mmol/L (ref 22–32)
CREATININE: 0.77 mg/dL (ref 0.44–1.00)
Chloride: 107 mmol/L (ref 101–111)
GFR calc Af Amer: >60 mL/min (ref >60)
GLUCOSE: 103 mg/dL — AB (ref 65–99)
POTASSIUM: 3.6 mmol/L (ref 3.5–5.1)
SODIUM: 140 mmol/L (ref 135–145)

## 2016-10-23 LAB — GLUCOSE, CAPILLARY
GLUCOSE-CAPILLARY: 82 mg/dL (ref 65–99)
GLUCOSE-CAPILLARY: 106 mg/dL — AB (ref 65–99)
GLUCOSE-CAPILLARY: 103 mg/dL — AB (ref 65–99)
Glucose-Capillary: 100 mg/dL — ABNORMAL HIGH (ref 65–99)
Glucose-Capillary: 109 mg/dL — ABNORMAL HIGH (ref 65–99)
Glucose-Capillary: 115 mg/dL — ABNORMAL HIGH (ref 65–99)

## 2016-10-23 MED ORDER — POTASSIUM CHLORIDE 20 MEQ/15ML (10%) PO SOLN
40.0000 meq | Freq: Two times a day (BID) | ORAL | Status: AC
  Administered 2016-10-23 (×2): 40 meq via ORAL
  Filled 2016-10-23 (×2): qty 30

## 2016-10-23 NOTE — Progress Notes (Signed)
Patient ID: Kendra Ortega, female   DOB: [birth year], 83 y.o.   MRN: [id number]  [facility] Progress Note     Subjective: CC- none Pt NGT fell out early yesterday AM.  No n/v.  Pt had BM and flatus.  Is walking 6 laps in the hall multiple times per day.  Objective: Vital signs in last 24 hours: Temp:  [97.7 F (36.5 C)-98.8 F (37.1 C)] 97.7 F (36.5 C) (07/04 0426) Pulse Rate:  [75-101] 78 (07/04 0426) Resp:  [16-18] 16 (07/04 0426) BP: (139-143)/(64-76) 139/64 (07/04 0426) SpO2:  [94 %-100 %] 97 % (07/04 0426) Last BM Date: 10/23/16  Intake/Output from previous day: 07/03 0701 - 07/04 0700 In: 2600 [I.V.:2400; IV Piggyback:200] Out: 850 [Urine:775; Emesis/NG output:75] Intake/Output this shift: Total I/O In: -  Out: 225 [Urine:225]  PE: Gen:  Alert, NAD, pleasant Pulm: Breathing comfortably Abd: soft, mild distention, non tender.   Ext:  No erythema, edema, or tenderness BUE/BLE  Psych: A&Ox3  Skin: no rashes noted, warm and drydry  Lab Results:   Recent Labs  10/22/16 0435 10/23/16 0452  WBC 11.4* 9.2  HGB 13.1 11.2*  HCT 39.2 34.3*  PLT 306 269   BMET  Recent Labs  10/22/16 0435 10/23/16 0452  NA 138 140  K 3.4* 3.6  CL 99* 107  CO2 30 24  GLUCOSE 129* 103*  BUN 17 21*  CREATININE 0.85 0.77  CALCIUM 9.0 8.3*   PT/INR No results for input(s): LABPROT, INR in the last 72 hours. CMP     Component Value Date/Time   NA 140 10/23/2016 0452   K 3.6 10/23/2016 0452   CL 107 10/23/2016 0452   CO2 24 10/23/2016 0452   GLUCOSE 103 (H) 10/23/2016 0452   BUN 21 (H) 10/23/2016 0452   CREATININE 0.77 10/23/2016 0452   CALCIUM 8.3 (L) 10/23/2016 0452   PROT 7.5 10/19/2016 1400   ALBUMIN 4.3 10/19/2016 1400   AST 23 10/19/2016 1400   ALT 16 10/19/2016 1400   ALKPHOS 86 10/19/2016 1400   BILITOT 0.6 10/19/2016 1400   GFRNONAA >60 10/23/2016 0452   GFRAA >60 10/23/2016 0452   Lipase     Component Value Date/Time   LIPASE 26  10/19/2016 1400       Studies/Results: Dg Abd Portable 1v  Result Date: 10/23/2016 CLINICAL DATA:  Small bowel obstruction EXAM: PORTABLE ABDOMEN - 1 VIEW COMPARISON:  10/21/2016 FINDINGS: NG tube has been removed. Mild small bowel distention in the mid abdomen. Gas and stool throughout the colon. No free air or organomegaly. IMPRESSION: Continued mild small bowel prominence in the mid abdomen. Electronically Signed   By: Rolm Baptise M.D.   On: 10/23/2016 07:10   Dg Abd Portable 1v  Result Date: 10/21/2016 CLINICAL DATA:  Follow-up small-bowel obstruction EXAM: PORTABLE ABDOMEN - 1 VIEW COMPARISON:  10/20/2016.  CT 10/19/2016 . FINDINGS: NG tube noted coiled in the stomach. Persistent dilated loops of small bowel noted without interim change. Stool noted again in the colon. Oral contrast not identified, possibly secondary to dilution. No free air. No acute bony abnormality. IMPRESSION: NG tube noted coiled stomach. Persistent dilated loops of small bowel are again noted without significant interim change. Stool is noted colon. No free air. Electronically Signed   By: Marcello Moores  Register   On: 10/21/2016 10:42    Anti-infectives: Anti-infectives    None       Assessment/Plan DM Hypothyroidism  SBO - abdominal surgical history: appendectomy, hysterectomy, ex  lap for SBO about 20 years ago - CT 6/30 showed evidence for small bowel obstruction in the mid to distal small bowel - having flatus/BM.  Xray today showed some persistently prominent SB loops, but xray can lag behind clinical picture. - pt did not get small bowel protocol. If she has trouble with diet advance, would do that.    ID - none VTE - lovenox FEN - IVF  Plan - Clear liquid diet.    LOS: 3 days    [place] , [facility] 10/23/2016, 9:57 AM

## 2016-10-23 NOTE — Progress Notes (Signed)
[facility] PROGRESS NOTE    Progress Note  Kendra Ortega  TIR:[id number] DOB: 1931-05-30 DOA: 10/19/2016 PCP: Javier Glazier, MD     Brief Narrative:   Kendra Ortega is an 83 y.o. female comes in for small bowel stricture.  Assessment/Plan:   Small bowel obstruction (HCC) NG tube last night, surgery recommended clear liquid diet. Appreciate surgery's assistance.  Hyperkalemia: Continue to replete orally.  Type 2 diabetes mellitus without complication (HCC) Pregnancy was 6.4, continue sliding scale insulin with every 4 hours CBGs. Next  Hypothyroidism Restart oral Synthroid in the morning on 10/24/2016.   DVT prophylaxis: lovenox Family Communication:none Disposition Plan/Barrier to D/C: home in 1 day Code Status:     Code Status Orders        Start     Ordered   10/19/16 2126  Full code  Continuous     10/19/16 2126    Code Status History    Date Active Date Inactive Code Status Order ID Comments User Context   This patient has a current code status but no historical code status.    Advance Directive Documentation     Most Recent Value  Type of Advance Directive  Healthcare Power of Attorney, Living will  Pre-existing out of facility DNR order (yellow form or pink MOST form)  -  "MOST" Form in Place?  -        IV Access:    Peripheral IV   Procedures and diagnostic studies:   Dg Abd Portable 1v  Result Date: 10/23/2016 CLINICAL DATA:  Small bowel obstruction EXAM: PORTABLE ABDOMEN - 1 VIEW COMPARISON:  10/21/2016 FINDINGS: NG tube has been removed. Mild small bowel distention in the mid abdomen. Gas and stool throughout the colon. No free air or organomegaly. IMPRESSION: Continued mild small bowel prominence in the mid abdomen. Electronically Signed   By: Rolm Baptise M.D.   On: 10/23/2016 07:10     Medical Consultants:    None.  Anti-Infectives:   none  Subjective:    Kendra Ortega no new complains passing gas.  Objective:      Vitals:   10/22/16 1258 10/22/16 1509 10/22/16 2215 10/23/16 0426  BP: (!) 141/65 (!) 143/76 (!) 143/72 139/64  Pulse: (!) 101 (!) 101 75 78  Resp: 18 18 18 16   Temp:  98.8 F (37.1 C) 98.6 F (37 C) 97.7 F (36.5 C)  TempSrc:  Oral Oral Oral  SpO2: 96% 100% 94% 97%  Weight:      Height:        Intake/Output Summary (Last 24 hours) at 10/23/16 1142 Last data filed at 10/23/16 0924  Gross per 24 hour  Intake          2599.99 ml  Output              725 ml  Net          1874.99 ml   Filed Weights   10/19/16 1255  Weight: 73.5 kg (162 lb)    Exam: General exam: In no acute distress. Respiratory system: Good air movement clear to auscultation. Cardiovascular system: Regular rate and rhythm with positive S1-S2 no murmurs rubs gallops Gastrointestinal system: Bowel sounds soft nontender nondistended Central nervous system: Weak alert and oriented 3 nonfocal Extremities: No Pedal edema. Skin: No rashes Psychiatry: Judgment and insight appeared normal.   Data Reviewed:    Labs: Basic Metabolic Panel:  Recent Labs Lab 10/19/16 1400 10/21/16 0421 10/22/16 0435 10/23/16 0452  NA 135  136 138 140  K 4.1 4.0 3.4* 3.6  CL 98* 100* 99* 107  CO2 27 25 30 24   GLUCOSE 110* 141* 129* 103*  BUN 14 12 17  21*  CREATININE 0.72 0.95 0.85 0.77  CALCIUM 9.5 8.9 9.0 8.3*  MG  --  2.2  --   --    GFR Estimated Creatinine Clearance: 53.8 mL/min (by C-G formula based on SCr of 0.77 mg/dL). Liver Function Tests:  Recent Labs Lab 10/19/16 1400  AST 23  ALT 16  ALKPHOS 86  BILITOT 0.6  PROT 7.5  ALBUMIN 4.3    Recent Labs Lab 10/19/16 1400  LIPASE 26   No results for input(s): AMMONIA in the last 168 hours. Coagulation profile  Recent Labs Lab 10/20/16 0437  INR 1.00    CBC:  Recent Labs Lab 10/19/16 1400 10/21/16 0421 10/22/16 0435 10/23/16 0452  WBC 11.0* 11.6* 11.4* 9.2  NEUTROABS 8.0* 8.2* 7.9* 5.0  HGB 12.8 13.4 13.1 11.2*  HCT 37.9 39.8  39.2 34.3*  MCV 100.3* 98.0 99.0 101.2*  PLT 282 319 306 269   Cardiac Enzymes: No results for input(s): CKTOTAL, CKMB, CKMBINDEX, TROPONINI in the last 168 hours. BNP (last 3 results) No results for input(s): PROBNP in the last 8760 hours. CBG:  Recent Labs Lab 10/22/16 1611 10/22/16 1951 10/22/16 2356 10/23/16 0410 10/23/16 0727  GLUCAP 126* 124* 92 82 100*   D-Dimer: No results for input(s): DDIMER in the last 72 hours. Hgb A1c:  Recent Labs  10/21/16 0421  HGBA1C 6.4*   Lipid Profile: No results for input(s): CHOL, HDL, LDLCALC, TRIG, CHOLHDL, LDLDIRECT in the last 72 hours. Thyroid function studies:  Recent Labs  10/21/16 0421  TSH 6.523*   Anemia work up: No results for input(s): VITAMINB12, FOLATE, FERRITIN, TIBC, IRON, RETICCTPCT in the last 72 hours. Sepsis Labs:  Recent Labs Lab 10/19/16 1400 10/21/16 0421 10/22/16 0435 10/23/16 0452  WBC 11.0* 11.6* 11.4* 9.2   Microbiology Recent Results (from the past 240 hour(s))  Culture, Urine     Status: Abnormal   Collection Time: 10/19/16  9:28 PM  Result Value Ref Range Status   Specimen Description URINE, RANDOM  Final   Special Requests NONE  Final   Culture MULTIPLE SPECIES PRESENT, SUGGEST RECOLLECTION (A)  Final   Report Status 10/22/2016 FINAL  Final     Medications:   . chlorhexidine  15 mL Mouth Rinse BID  . enoxaparin (LOVENOX) injection  40 mg Subcutaneous Q24H  . insulin aspart  0-9 Units Subcutaneous Q4H  . levothyroxine  25 mcg Intravenous Daily  . mouth rinse  15 mL Mouth Rinse q12n4p   Continuous Infusions: . sodium chloride 1,000 mL (10/23/16 0631)    Time spent: 25 min   LOS: 3 days   Charlynne Cousins  [facility] Pager [phone]  *Please refer to [email or web address], password [email or web address] to get updated schedule on who will round on this patient, as hospitalists switch teams weekly. If 7PM-7AM, please contact night-coverage at [email or web address], password [email or web address] for any overnight  needs.  10/23/2016, 11:42 AM

## 2016-10-24 LAB — GLUCOSE, CAPILLARY
GLUCOSE-CAPILLARY: 116 mg/dL — AB (ref 65–99)
GLUCOSE-CAPILLARY: 136 mg/dL — AB (ref 65–99)
Glucose-Capillary: 91 mg/dL (ref 65–99)
Glucose-Capillary: 100 mg/dL — ABNORMAL HIGH (ref 65–99)
Glucose-Capillary: 99 mg/dL (ref 65–99)

## 2016-10-24 LAB — BASIC METABOLIC PANEL
Anion gap: 8 (ref 5–15)
BUN: 15 mg/dL (ref 6–20)
CALCIUM: 8.5 mg/dL — AB (ref 8.9–10.3)
CO2: 22 mmol/L (ref 22–32)
CREATININE: 0.66 mg/dL (ref 0.44–1.00)
Chloride: 106 mmol/L (ref 101–111)
Glucose, Bld: 100 mg/dL — ABNORMAL HIGH (ref 65–99)
Potassium: 4 mmol/L (ref 3.5–5.1)
SODIUM: 136 mmol/L (ref 135–145)

## 2016-10-24 MED ORDER — PSYLLIUM 95 % PO PACK
1.0000 | PACK | Freq: Every day | ORAL | Status: DC
  Administered 2016-10-24 – 2016-10-25 (×2): 1 via ORAL
  Filled 2016-10-24 (×2): qty 1

## 2016-10-24 NOTE — Progress Notes (Signed)
Patient ID: Kendra Ortega, female   DOB: [birth year], 83 y.o.   MRN: [id number]  [facility] Surgery Progress Note     Subjective: CC- SBO No complaints this morning. Tolerating clear liquids. Just had another BM. Denies n/v. Denies any abdominal pain. States that her abdomen feels nearly normal.  Objective: Vital signs in last 24 hours: Temp:  [97.9 F (36.6 C)-98.3 F (36.8 C)] 97.9 F (36.6 C) (07/05 0513) Pulse Rate:  [65-70] 65 (07/05 0513) Resp:  [16-17] 16 (07/05 0513) BP: (138-151)/(62-68) 146/66 (07/05 0513) SpO2:  [96 %-98 %] 96 % (07/05 0513) Last BM Date: 10/23/16  Intake/Output from previous day: 07/04 0701 - 07/05 0700 In: [place] [P.O.:1257; I.V.:510] Out: 1225 [Urine:1225] Intake/Output this shift: Total I/O In: 310 [P.O.:240; I.V.:70] Out: 700 [Urine:700]  PE: Gen: Alert, NAD, pleasant HEENT: EOM's intact, pupils equal  Card: RRR, no M/G/R heard Pulm: CTAB, no W/R/R, effort normal Abd: well healed midline incision, soft, minimal distension, nontender, +BS Ext: No erythema, edema, or tenderness BUE/BLE  Psych: A&Ox3  Skin: no rashes noted, warm and drydry  Lab Results:   Recent Labs  10/22/16 0435 10/23/16 0452  WBC 11.4* 9.2  HGB 13.1 11.2*  HCT 39.2 34.3*  PLT 306 269   BMET  Recent Labs  10/23/16 0452 10/24/16 0439  NA 140 136  K 3.6 4.0  CL 107 106  CO2 24 22  GLUCOSE 103* 100*  BUN 21* 15  CREATININE 0.77 0.66  CALCIUM 8.3* 8.5*   PT/INR No results for input(s): LABPROT, INR in the last 72 hours. CMP     Component Value Date/Time   NA 136 10/24/2016 0439   K 4.0 10/24/2016 0439   CL 106 10/24/2016 0439   CO2 22 10/24/2016 0439   GLUCOSE 100 (H) 10/24/2016 0439   BUN 15 10/24/2016 0439   CREATININE 0.66 10/24/2016 0439   CALCIUM 8.5 (L) 10/24/2016 0439   PROT 7.5 10/19/2016 1400   ALBUMIN 4.3 10/19/2016 1400   AST 23 10/19/2016 1400   ALT 16 10/19/2016 1400   ALKPHOS 86 10/19/2016 1400   BILITOT 0.6 10/19/2016  1400   GFRNONAA >60 10/24/2016 0439   GFRAA >60 10/24/2016 0439   Lipase     Component Value Date/Time   LIPASE 26 10/19/2016 1400       Studies/Results: Dg Abd Portable 1v  Result Date: 10/23/2016 CLINICAL DATA:  Small bowel obstruction EXAM: PORTABLE ABDOMEN - 1 VIEW COMPARISON:  10/21/2016 FINDINGS: NG tube has been removed. Mild small bowel distention in the mid abdomen. Gas and stool throughout the colon. No free air or organomegaly. IMPRESSION: Continued mild small bowel prominence in the mid abdomen. Electronically Signed   By: Rolm Baptise M.D.   On: 10/23/2016 07:10    Anti-infectives: Anti-infectives    None       Assessment/Plan DM Hypothyroidism  SBO - abdominal surgical history: appendectomy, hysterectomy, ex lap for SBO about 20 years ago - CT 6/30 showed evidence for small bowel obstruction in the mid to distal small bowel - now tolerating clears and having bowel function  ID - none VTE - lovenox FEN - full liquids  Plan - Advance to full liquids for today. If Ms. Hritz tolerates fulls today and continues to have bowel function, will plan to advance to soft diet tomorrow and possibly discharge tomorrow.   LOS: 4 days    Jerrye Beavers , [facility] 10/24/2016, 10:26 AM Pager: [phone] Consults: [phone] Mon-Fri 7:00  am-4:30 pm Sat-Sun 7:00 am-11:30 am

## 2016-10-24 NOTE — Progress Notes (Signed)
Pt checked frequently, rested quietly at long intervals with eyes closed, resp even and unlabored, no acute distress noted. Abdomen soft, bowels sounds noted in all quadrants, with upper quadrants faintly noted at initial shift assessment. Pt ambulated around entire unit for a total of 15 laps during this shift, tolerated well. Has been up to BR, voiding well. Uneventful night.

## 2016-10-25 LAB — GLUCOSE, CAPILLARY
Glucose-Capillary: 95 mg/dL (ref 65–99)
Glucose-Capillary: 91 mg/dL (ref 65–99)
Glucose-Capillary: 97 mg/dL (ref 65–99)

## 2016-10-25 MED ORDER — LEVOTHYROXINE SODIUM 50 MCG PO TABS: 50.0000 ug | ORAL_TABLET | Freq: Every day | ORAL | Status: DC

## 2016-10-25 NOTE — Progress Notes (Signed)
Pt tolerated diet with no complaints of nausea and no complaints of pain.  D/C instructions were given.

## 2016-10-25 NOTE — Progress Notes (Signed)
Patient ID: Kendra Ortega, female   DOB: [birth year], 83 y.o.   MRN: [id number]  [facility] Surgery Progress Note     Subjective: CC- SBO Feels great today. States that she feels normal again. Denies abdominal pain, n/v, or distension. Ambulating with no issues. Tolerating full liquids. Had a BM this morning. Ready to go home.  Objective: Vital signs in last 24 hours: Temp:  [97.6 F (36.4 C)-98.1 F (36.7 C)] 98.1 F (36.7 C) (07/06 0500) Pulse Rate:  [54-69] 54 (07/06 0500) Resp:  [14-18] 18 (07/06 0500) BP: (130-149)/(59-82) 134/76 (07/06 0500) SpO2:  [92 %-99 %] 99 % (07/06 0500) Last BM Date: 10/23/16  Intake/Output from previous day: 07/05 0701 - 07/06 0700 In: 790 [P.O.:680; I.V.:110] Out: 2000 [Urine:2000] Intake/Output this shift: No intake/output data recorded.  PE: Gen: Alert, NAD, pleasant HEENT: EOM's intact, pupils equal  Card: RRR, no M/G/R heard Pulm: CTAB, no W/R/R, effort normal Abd: well healed midline incision, soft, nondistended, nontender, +BS Ext: No erythema, edema, or tenderness BUE/BLE  Psych: A&Ox3  Skin: no rashes noted, warm and drydry   Lab Results:   Recent Labs  10/23/16 0452  WBC 9.2  HGB 11.2*  HCT 34.3*  PLT 269   BMET  Recent Labs  10/23/16 0452 10/24/16 0439  NA 140 136  K 3.6 4.0  CL 107 106  CO2 24 22  GLUCOSE 103* 100*  BUN 21* 15  CREATININE 0.77 0.66  CALCIUM 8.3* 8.5*   PT/INR No results for input(s): LABPROT, INR in the last 72 hours. CMP     Component Value Date/Time   NA 136 10/24/2016 0439   K 4.0 10/24/2016 0439   CL 106 10/24/2016 0439   CO2 22 10/24/2016 0439   GLUCOSE 100 (H) 10/24/2016 0439   BUN 15 10/24/2016 0439   CREATININE 0.66 10/24/2016 0439   CALCIUM 8.5 (L) 10/24/2016 0439   PROT 7.5 10/19/2016 1400   ALBUMIN 4.3 10/19/2016 1400   AST 23 10/19/2016 1400   ALT 16 10/19/2016 1400   ALKPHOS 86 10/19/2016 1400   BILITOT 0.6 10/19/2016 1400   GFRNONAA >60 10/24/2016 0439    GFRAA >60 10/24/2016 0439   Lipase     Component Value Date/Time   LIPASE 26 10/19/2016 1400       Studies/Results: No results found.  Anti-infectives: Anti-infectives    None       Assessment/Plan DM Hypothyroidism  SBO - abdominal surgical history: appendectomy, hysterectomy, ex lap for SBO about 20 years ago - CT 6/30 showed evidence for small bowel obstruction in the mid to distal small bowel - resolved  ID - none VTE - lovenox FEN - soft diet  Plan - Advance to soft diet. [place] for discharge today if she does well with soft diet. No need for surgical followup.   LOS: 5 days    Jerrye Beavers , [facility] Surgery 10/25/2016, 9:08 AM Pager: [phone] Consults: [phone] Mon-Fri 7:00 am-4:30 pm Sat-Sun 7:00 am-11:30 am

## 2016-10-25 NOTE — Discharge Summary (Addendum)
Physician Discharge Summary  Phylisha Dix FIE:[id number] DOB: [birth year] DOA: 10/19/2016  PCP: Javier Glazier, MD  Admit date: 10/19/2016 Discharge date: 11/06/2016  Admitted From: home Disposition:  Home  Recommendations for Outpatient Follow-up:  1. Follow up with Surgery in 1-2 weeks   Home Health:No Equipment/Devices:None  Discharge Condition:stable CODE STATUS:full Diet recommendation: Heart Healthy   Brief/Interim Summary: Kendra Ortega is an 83 y.o. female comes in for small bowel stricture.  Discharge Diagnoses:  Partial Small bowel obstruction [facility]): Surgery consulted. Treated conservatively on admission, NG tube place, electrolytes repleted. Once she started passing gas NG clamped, started clear liq diet and advance it as tolerate it.  Type 2 diabetes mellitus without complication (HCC) No changes made to her medications.  Hypothyroidism No changed made to her medications.  Discharge Instructions  Discharge Instructions    Diet - low sodium heart healthy    Complete by:  As directed    Increase activity slowly    Complete by:  As directed      Allergies as of 10/25/2016   No Known Allergies     Medication List    TAKE these medications   CALCIUM 600 600 MG Tabs tablet Generic drug:  calcium carbonate Take 600 mg by mouth every other day.   docusate sodium 100 MG capsule Commonly known as:  COLACE Take 100 mg by mouth 2 (two) times daily.   glimepiride 1 MG tablet Commonly known as:  AMARYL Take 1 mg by mouth daily with breakfast.   levothyroxine 50 MCG tablet Commonly known as:  SYNTHROID, LEVOTHROID Take 50 mcg by mouth daily before breakfast.   MULTIVITAMIN WOMEN PO Take 1 tablet by mouth daily.   Vitamin D3 1000 units Caps Take 1,000 Units by mouth every other day.       No Known Allergies  Consultations:  General surgery   Procedures/Studies: Ct Abdomen Pelvis W Contrast  Addendum Date: 10/19/2016   ADDENDUM REPORT:  10/19/2016 16:38 ADDENDUM: Voice recognition error in the first sentence of the first impression which should read Evidence for small bowel obstruction in the mid to distal small bowel. Electronically Signed   By: Rolm Baptise M.D.   On: 10/19/2016 16:38   Result Date: 10/19/2016 CLINICAL DATA:  Upper abdominal pain.  History of bowel obstruction. EXAM: CT ABDOMEN AND PELVIS WITH CONTRAST TECHNIQUE: Multidetector CT imaging of the abdomen and pelvis was performed using the standard protocol following bolus administration of intravenous contrast. CONTRAST:  171mL ISOVUE-300 IOPAMIDOL (ISOVUE-300) INJECTION 61% COMPARISON:  None. FINDINGS: Lower chest: Linear scarring in the lung bases. No effusions. Heart is normal size. Hepatobiliary: No focal hepatic abnormality. Gallbladder unremarkable. Pancreas: No focal abnormality or ductal dilatation. Spleen: No focal abnormality.  Normal size. Adrenals/Urinary Tract: Small low-density nodule in the right adrenal gland, difficult to characterize on this contrast-enhanced study, but likely a small adenoma. This measures 15 mm. Left adrenal gland and kidneys are unremarkable. Urinary bladder unremarkable. Stomach/Bowel: Dilated proximal and mid small bowel loops. Distal small bowel loops are decompressed. Findings compatible with small bowel obstruction. Exact transition and cause not identified. Vascular/Lymphatic: Aortic and iliac calcifications. No aneurysm or adenopathy. Reproductive: Prior hysterectomy.  No adnexal masses. Other: Trace free fluid in the pelvis.  No free air. Musculoskeletal: No acute bony abnormality. IMPRESSION: No evidence for small bowel obstruction in the mid to distal small bowel. Exact transition and cause not visualized. Prior hysterectomy. Aortoiliac atherosclerosis. Electronically Signed: By: Rolm Baptise M.D. On: 10/19/2016 16:23  Dg Abd Portable 1v  Result Date: 10/23/2016 CLINICAL DATA:  Small bowel obstruction EXAM: PORTABLE ABDOMEN - 1  VIEW COMPARISON:  10/21/2016 FINDINGS: NG tube has been removed. Mild small bowel distention in the mid abdomen. Gas and stool throughout the colon. No free air or organomegaly. IMPRESSION: Continued mild small bowel prominence in the mid abdomen. Electronically Signed   By: Rolm Baptise M.D.   On: 10/23/2016 07:10   Dg Abd Portable 1v  Result Date: 10/21/2016 CLINICAL DATA:  Follow-up small-bowel obstruction EXAM: PORTABLE ABDOMEN - 1 VIEW COMPARISON:  10/20/2016.  CT 10/19/2016 . FINDINGS: NG tube noted coiled in the stomach. Persistent dilated loops of small bowel noted without interim change. Stool noted again in the colon. Oral contrast not identified, possibly secondary to dilution. No free air. No acute bony abnormality. IMPRESSION: NG tube noted coiled stomach. Persistent dilated loops of small bowel are again noted without significant interim change. Stool is noted colon. No free air. Electronically Signed   By: Marcello Moores  Register   On: 10/21/2016 10:42   Dg Abd Portable 1v-small Bowel Obstruction Protocol-initial, 8 Hr Delay  Result Date: 10/20/2016 CLINICAL DATA:  83 year old female 8 hour delayed status post enteric contrast by NG tube. EXAM: PORTABLE ABDOMEN - 1 VIEW COMPARISON:  0958 hours today.  CT Abdomen and Pelvis 10/19/2016. FINDINGS: AP view of the abdomen and pelvis at 1848 hours. Stable enteric tube, side hole at the gastric fundus level. There is minimal flocculated contrast visible in the stomach and left upper quadrant small bowel, probably the proximal jejunum just beyond the ligament of Treitz. There does appear to be diluted contrast in the dilated left abdominal small bowel loops (arrows). No distal small bowel or large bowel contrast identified. There does appear to be excreted IV contrast in the urinary bladder. Stable lung bases. No acute osseous abnormality identified. Aortoiliac calcified atherosclerosis. IMPRESSION: 1. No enteric contrast has reached the large bowel. I suspect  most of the contrast has been diluted in the fluid-filled small bowel loops in the left abdomen. There is minimal residual flocculated contrast in the stomach and at the ligament of Treitz. 2. Stable NG tube. Electronically Signed   By: Genevie Ann M.D.   On: 10/20/2016 19:13   Dg Abd Portable 1v-small Bowel Protocol-position Verification  Result Date: 10/20/2016 CLINICAL DATA:  Nasogastric placement. EXAM: PORTABLE ABDOMEN - 1 VIEW COMPARISON:  None. FINDINGS: Nasogastric tube enters the stomach in has its tip in the fundus. IMPRESSION: Nasogastric tube enters the stomach in has its tip in the fundus. Electronically Signed   By: Nelson Chimes M.D.   On: 10/20/2016 11:11    Subjective: No complains  Discharge Exam: Vitals:   10/25/16 0139 10/25/16 0500  BP: (!) 142/59 134/76  Pulse: 66 (!) 54  Resp: 14 18  Temp: 98.1 F (36.7 C) 98.1 F (36.7 C)   Vitals:   10/24/16 1357 10/24/16 1900 10/25/16 0139 10/25/16 0500  BP: 130/82 (!) 149/69 (!) 142/59 134/76  Pulse: 61 69 66 (!) 54  Resp: 16 16 14 18   Temp: 97.6 F (36.4 C) 98.1 F (36.7 C) 98.1 F (36.7 C) 98.1 F (36.7 C)  TempSrc: Oral Oral Oral Oral  SpO2: 92% 98% 98% 99%  Weight:      Height:        General: In no acute distress Cardiovascular: RRR, + S1 and S2. Respiratory: Good air movement CTA B/L Abdominal: + BS soft non tender or distended. Extremities: no edema.  The results of significant diagnostics from this hospitalization (including imaging, microbiology, ancillary and laboratory) are listed below for reference.     Microbiology: No results found for this or any previous visit (from the past 240 hour(s)).   Labs: BNP (last 3 results) No results for input(s): BNP in the last 8760 hours. Basic Metabolic Panel: No results for input(s): NA, K, CL, CO2, GLUCOSE, BUN, CREATININE, CALCIUM, MG, PHOS in the last 168 hours. Liver Function Tests: No results for input(s): AST, ALT, ALKPHOS, BILITOT, PROT, ALBUMIN in  the last 168 hours. No results for input(s): LIPASE, AMYLASE in the last 168 hours. No results for input(s): AMMONIA in the last 168 hours. CBC: No results for input(s): WBC, NEUTROABS, HGB, HCT, MCV, PLT in the last 168 hours. Cardiac Enzymes: No results for input(s): CKTOTAL, CKMB, CKMBINDEX, TROPONINI in the last 168 hours. BNP: Invalid input(s): POCBNP CBG: No results for input(s): GLUCAP in the last 168 hours. D-Dimer No results for input(s): DDIMER in the last 72 hours. Hgb A1c No results for input(s): HGBA1C in the last 72 hours. Lipid Profile No results for input(s): CHOL, HDL, LDLCALC, TRIG, CHOLHDL, LDLDIRECT in the last 72 hours. Thyroid function studies No results for input(s): TSH, T4TOTAL, T3FREE, THYROIDAB in the last 72 hours.  Invalid input(s): FREET3 Anemia work up No results for input(s): VITAMINB12, FOLATE, FERRITIN, TIBC, IRON, RETICCTPCT in the last 72 hours. Urinalysis    Component Value Date/Time   COLORURINE YELLOW 10/19/2016 1715   APPEARANCEUR CLEAR 10/19/2016 1715   LABSPEC 1.045 (H) 10/19/2016 1715   PHURINE 7.5 10/19/2016 1715   GLUCOSEU NEGATIVE 10/19/2016 1715   HGBUR NEGATIVE 10/19/2016 [place] 10/19/2016 1715   KETONESUR NEGATIVE 10/19/2016 1715   PROTEINUR NEGATIVE 10/19/2016 1715   NITRITE NEGATIVE 10/19/2016 1715   LEUKOCYTESUR MODERATE (A) 10/19/2016 1715   Sepsis Labs Invalid input(s): PROCALCITONIN,  WBC,  LACTICIDVEN Microbiology No results found for this or any previous visit (from the past 240 hour(s)).   Time coordinating discharge: Over 30 minutes  SIGNED:   Charlynne Cousins, MD  [facility] 11/06/2016, 1:26 PM Pager   If 7PM-7AM, please contact night-coverage [email or web address] Password [email or web address]

## 2016-10-25 NOTE — Progress Notes (Signed)
Pt checked frequently, rested quietly at long intervals with eyes closed, resp even and unlabored. No acute distress noted. Awakens easily to verbal stimuli, no c/o voiced. Uneventful night.

## 2017-07-12 ENCOUNTER — Inpatient Hospital Stay (HOSPITAL_BASED_OUTPATIENT_CLINIC_OR_DEPARTMENT_OTHER)
Admission: EM | Admit: 2017-07-12 | Discharge: 2017-07-18 | DRG: 390 | Disposition: A | Discharge status: Home / Self Care | Payer: Medicare Other | Attending: Internal Medicine | Admitting: Internal Medicine

## 2017-07-12 ENCOUNTER — Encounter (HOSPITAL_BASED_OUTPATIENT_CLINIC_OR_DEPARTMENT_OTHER): Payer: Self-pay | Admitting: Emergency Medicine

## 2017-07-12 ENCOUNTER — Other Ambulatory Visit: Payer: Self-pay

## 2017-07-12 DIAGNOSIS — K56600 Partial intestinal obstruction, unspecified as to cause: Secondary | ICD-10-CM | POA: Diagnosis not present

## 2017-07-12 DIAGNOSIS — K56609 Unspecified intestinal obstruction, unspecified as to partial versus complete obstruction: Secondary | ICD-10-CM

## 2017-07-12 DIAGNOSIS — E876 Hypokalemia: Secondary | ICD-10-CM | POA: Diagnosis present

## 2017-07-12 DIAGNOSIS — Z7984 Long term (current) use of oral hypoglycemic drugs: Secondary | ICD-10-CM

## 2017-07-12 DIAGNOSIS — Z0189 Encounter for other specified special examinations: Secondary | ICD-10-CM

## 2017-07-12 DIAGNOSIS — R109 Unspecified abdominal pain: Secondary | ICD-10-CM

## 2017-07-12 DIAGNOSIS — Z90710 Acquired absence of both cervix and uterus: Secondary | ICD-10-CM

## 2017-07-12 DIAGNOSIS — Z9049 Acquired absence of other specified parts of digestive tract: Secondary | ICD-10-CM

## 2017-07-12 DIAGNOSIS — Z853 Personal history of malignant neoplasm of breast: Secondary | ICD-10-CM

## 2017-07-12 DIAGNOSIS — R1010 Upper abdominal pain, unspecified: Secondary | ICD-10-CM | POA: Diagnosis not present

## 2017-07-12 DIAGNOSIS — D539 Nutritional anemia, unspecified: Secondary | ICD-10-CM | POA: Diagnosis present

## 2017-07-12 DIAGNOSIS — E039 Hypothyroidism, unspecified: Secondary | ICD-10-CM | POA: Diagnosis present

## 2017-07-12 DIAGNOSIS — D72829 Elevated white blood cell count, unspecified: Secondary | ICD-10-CM | POA: Diagnosis present

## 2017-07-12 DIAGNOSIS — Z79890 Hormone replacement therapy (postmenopausal): Secondary | ICD-10-CM

## 2017-07-12 DIAGNOSIS — E119 Type 2 diabetes mellitus without complications: Secondary | ICD-10-CM | POA: Diagnosis present

## 2017-07-12 HISTORY — DX: Disorder of thyroid, unspecified: E07.9

## 2017-07-12 NOTE — ED Triage Notes (Signed)
Patient presents via ems with complaints of upper L and R abd pain onset 5 hours pta; states vomiting x 2; 8/10 intermittent pain; hx of intestinal blockage; no acute distress noted; patient ambulatory to room via ems

## 2017-07-13 ENCOUNTER — Emergency Department (HOSPITAL_BASED_OUTPATIENT_CLINIC_OR_DEPARTMENT_OTHER): Payer: Medicare Other

## 2017-07-13 ENCOUNTER — Inpatient Hospital Stay (HOSPITAL_COMMUNITY): Payer: Medicare Other

## 2017-07-13 DIAGNOSIS — E876 Hypokalemia: Secondary | ICD-10-CM | POA: Diagnosis present

## 2017-07-13 DIAGNOSIS — D539 Nutritional anemia, unspecified: Secondary | ICD-10-CM | POA: Diagnosis present

## 2017-07-13 DIAGNOSIS — Z90710 Acquired absence of both cervix and uterus: Secondary | ICD-10-CM | POA: Diagnosis not present

## 2017-07-13 DIAGNOSIS — E039 Hypothyroidism, unspecified: Secondary | ICD-10-CM | POA: Diagnosis present

## 2017-07-13 DIAGNOSIS — K56609 Unspecified intestinal obstruction, unspecified as to partial versus complete obstruction: Secondary | ICD-10-CM | POA: Diagnosis present

## 2017-07-13 DIAGNOSIS — E119 Type 2 diabetes mellitus without complications: Secondary | ICD-10-CM

## 2017-07-13 DIAGNOSIS — K56600 Partial intestinal obstruction, unspecified as to cause: Secondary | ICD-10-CM | POA: Diagnosis present

## 2017-07-13 DIAGNOSIS — Z7984 Long term (current) use of oral hypoglycemic drugs: Secondary | ICD-10-CM | POA: Diagnosis not present

## 2017-07-13 DIAGNOSIS — Z0189 Encounter for other specified special examinations: Secondary | ICD-10-CM | POA: Diagnosis not present

## 2017-07-13 DIAGNOSIS — Z79890 Hormone replacement therapy (postmenopausal): Secondary | ICD-10-CM | POA: Diagnosis not present

## 2017-07-13 DIAGNOSIS — Z9049 Acquired absence of other specified parts of digestive tract: Secondary | ICD-10-CM | POA: Diagnosis not present

## 2017-07-13 DIAGNOSIS — R1010 Upper abdominal pain, unspecified: Secondary | ICD-10-CM | POA: Diagnosis present

## 2017-07-13 DIAGNOSIS — R109 Unspecified abdominal pain: Secondary | ICD-10-CM | POA: Diagnosis not present

## 2017-07-13 DIAGNOSIS — Z853 Personal history of malignant neoplasm of breast: Secondary | ICD-10-CM | POA: Diagnosis not present

## 2017-07-13 DIAGNOSIS — R1031 Right lower quadrant pain: Secondary | ICD-10-CM | POA: Diagnosis not present

## 2017-07-13 DIAGNOSIS — D72829 Elevated white blood cell count, unspecified: Secondary | ICD-10-CM | POA: Diagnosis present

## 2017-07-13 LAB — CBC
HEMATOCRIT: 37.5 % (ref 36.0–46.0)
Hemoglobin: 12.7 g/dL (ref 12.0–15.0)
MCH: 33.4 pg (ref 26.0–34.0)
MCHC: 33.9 g/dL (ref 30.0–36.0)
MCV: 98.7 fL (ref 78.0–100.0)
Platelets: 284 10*3/uL (ref 150–400)
RBC: 3.8 MIL/uL — ABNORMAL LOW (ref 3.87–5.11)
RDW: 11.8 % (ref 11.5–15.5)
WBC: 16.5 10*3/uL — ABNORMAL HIGH (ref 4.0–10.5)

## 2017-07-13 LAB — URINALYSIS, ROUTINE W REFLEX MICROSCOPIC
BILIRUBIN URINE: NEGATIVE
GLUCOSE, UA: NEGATIVE mg/dL
KETONES UR: NEGATIVE mg/dL
Nitrite: NEGATIVE
PH: 7.5 (ref 5.0–8.0)
Protein, ur: NEGATIVE mg/dL
Specific Gravity, Urine: <1.005 — ABNORMAL LOW (ref 1.005–1.030)

## 2017-07-13 LAB — COMPREHENSIVE METABOLIC PANEL
ALBUMIN: 4.6 g/dL (ref 3.5–5.0)
ALT: 18 U/L (ref 14–54)
AST: 24 U/L (ref 15–41)
Alkaline Phosphatase: 86 U/L (ref 38–126)
Anion gap: 13 (ref 5–15)
BILIRUBIN TOTAL: 0.4 mg/dL (ref 0.3–1.2)
BUN: 18 mg/dL (ref 6–20)
CHLORIDE: 99 mmol/L — AB (ref 101–111)
CO2: 23 mmol/L (ref 22–32)
CREATININE: 0.95 mg/dL (ref 0.44–1.00)
Calcium: 9.9 mg/dL (ref 8.9–10.3)
GFR calc Af Amer: >60 mL/min (ref >60)
GFR calc non Af Amer: 54 mL/min — ABNORMAL LOW (ref >60)
GLUCOSE: 188 mg/dL — AB (ref 65–99)
POTASSIUM: 3.9 mmol/L (ref 3.5–5.1)
Sodium: 135 mmol/L (ref 135–145)
TOTAL PROTEIN: 7.7 g/dL (ref 6.5–8.1)

## 2017-07-13 LAB — GLUCOSE, CAPILLARY
GLUCOSE-CAPILLARY: 191 mg/dL — AB (ref 65–99)
GLUCOSE-CAPILLARY: 128 mg/dL — AB (ref 65–99)
GLUCOSE-CAPILLARY: 166 mg/dL — AB (ref 65–99)
Glucose-Capillary: 153 mg/dL — ABNORMAL HIGH (ref 65–99)

## 2017-07-13 LAB — URINALYSIS, MICROSCOPIC (REFLEX)

## 2017-07-13 LAB — LIPASE, BLOOD: Lipase: 28 U/L (ref 11–51)

## 2017-07-13 MED ORDER — IOPAMIDOL (ISOVUE-300) INJECTION 61%
100.0000 mL | Freq: Once | INTRAVENOUS | Status: AC | PRN
  Administered 2017-07-13: 100 mL via INTRAVENOUS

## 2017-07-13 MED ORDER — MORPHINE SULFATE (PF) 2 MG/ML IV SOLN
INTRAVENOUS | Status: AC
  Filled 2017-07-13: qty 1

## 2017-07-13 MED ORDER — INSULIN ASPART 100 UNIT/ML ~~LOC~~ SOLN
0.0000 [IU] | SUBCUTANEOUS | Status: DC
  Administered 2017-07-13 (×2): 2 [IU] via SUBCUTANEOUS
  Administered 2017-07-14 – 2017-07-17 (×11): 1 [IU] via SUBCUTANEOUS

## 2017-07-13 MED ORDER — ENOXAPARIN SODIUM 40 MG/0.4ML ~~LOC~~ SOLN
40.0000 mg | SUBCUTANEOUS | Status: DC
  Administered 2017-07-14 – 2017-07-18 (×5): 40 mg via SUBCUTANEOUS
  Filled 2017-07-13 (×5): qty 0.4

## 2017-07-13 MED ORDER — SODIUM CHLORIDE 0.9 % IV SOLN
INTRAVENOUS | Status: DC
  Administered 2017-07-14 – 2017-07-16 (×5): via INTRAVENOUS
  Administered 2017-07-16: 75 mL/h via INTRAVENOUS

## 2017-07-13 MED ORDER — SODIUM CHLORIDE 0.9 % IV SOLN
INTRAVENOUS | Status: DC
  Administered 2017-07-13: 04:00:00 via INTRAVENOUS

## 2017-07-13 MED ORDER — LEVOTHYROXINE SODIUM 100 MCG IV SOLR
25.0000 ug | Freq: Every day | INTRAVENOUS | Status: DC
  Administered 2017-07-13 – 2017-07-17 (×5): 25 ug via INTRAVENOUS
  Filled 2017-07-13 (×5): qty 5

## 2017-07-13 MED ORDER — ONDANSETRON HCL 4 MG/2ML IJ SOLN
4.0000 mg | Freq: Four times a day (QID) | INTRAMUSCULAR | Status: DC | PRN
  Administered 2017-07-13: 4 mg via INTRAVENOUS
  Filled 2017-07-13: qty 2

## 2017-07-13 MED ORDER — MORPHINE SULFATE (PF) 2 MG/ML IV SOLN
2.0000 mg | INTRAVENOUS | Status: DC | PRN
  Administered 2017-07-13 (×3): 2 mg via INTRAVENOUS
  Filled 2017-07-13 (×2): qty 1

## 2017-07-13 MED ORDER — ONDANSETRON HCL 4 MG/2ML IJ SOLN
4.0000 mg | Freq: Once | INTRAMUSCULAR | Status: AC
  Administered 2017-07-13: 4 mg via INTRAVENOUS
  Filled 2017-07-13: qty 2

## 2017-07-13 MED ORDER — ONDANSETRON HCL 4 MG PO TABS: 4.0000 mg | ORAL_TABLET | Freq: Four times a day (QID) | ORAL | Status: DC | PRN

## 2017-07-13 MED ORDER — ACETAMINOPHEN 650 MG RE SUPP: 650.0000 mg | Freq: Four times a day (QID) | RECTAL | Status: DC | PRN

## 2017-07-13 MED ORDER — ACETAMINOPHEN 325 MG PO TABS
650.0000 mg | ORAL_TABLET | Freq: Four times a day (QID) | ORAL | Status: DC | PRN
  Administered 2017-07-15 – 2017-07-17 (×3): 650 mg via ORAL
  Filled 2017-07-13 (×3): qty 2

## 2017-07-13 MED ORDER — DIATRIZOATE MEGLUMINE & SODIUM 66-10 % PO SOLN
90.0000 mL | Freq: Once | ORAL | Status: AC
  Administered 2017-07-13: 90 mL via NASOGASTRIC
  Filled 2017-07-13: qty 90

## 2017-07-13 MED ORDER — LIP MEDEX EX OINT
TOPICAL_OINTMENT | CUTANEOUS | Status: AC
  Administered 2017-07-13: 04:00:00
  Filled 2017-07-13: qty 7

## 2017-07-13 NOTE — Progress Notes (Signed)
[facility]  Patient admitted after midnight by my partner, see H&P for full details.  Chart reviewed, patient seen and examined.  84 year old pleasant female presented to the emergency department with abdominal pain, CT scan found to have small bowel obstruction.  Patient was admitted to the medical floor, NG tube has been placed, and surgery has been consulted who is recommending conservative management.   Will continue to monitor.  Rest per H&P  Chipper Oman, MD

## 2017-07-13 NOTE — Progress Notes (Signed)
New order received from MD to place NG tube, patient states that she has not vomited and is no longer feeling nauseous and wants to hold off on the NG tube for now.  Dr. Quincy Simmonds made aware via text page.

## 2017-07-13 NOTE — Plan of Care (Addendum)
84 yo F, h/o SBO last July treated conservatively.  Presents to ED with same.  Med-surg IP for SBO.  Call flow manager on patient arrival.

## 2017-07-13 NOTE — H&P (Addendum)
History and Physical    Kendra Ortega QIW:[id number] DOB: June 20, 1931 DOA: 07/12/2017  PCP: Kendra Glazier, MD  Patient coming from: Home  I have personally briefly reviewed patient's old medical records in [place]  Chief Complaint: Abd pain  HPI: Kendra Ortega is a 84 y.o. female with medical history significant of SBO, some 20 years ago and then again in July last year.  SBO in July last year was managed conservatively with just bowel rest and NGT.  Patient presents to the ED with c/o abd pain, N/V.  Symptoms onset 5h PTA.  2 episodes of vomiting 8/10 intermittent pain.   ED Course: Small BMs since onset.  CT shows SBO.  Pain controled with 2mg  morphine, zofran for nausea.  No further vomiting episodes since then.   Review of Systems: As per HPI otherwise 10 point review of systems negative.   Past Medical History:  Diagnosis Date  . Bowel obstruction ([place])   . Cancer [facility])    right breast cancer  . Thyroid disease     Past Surgical History:  Procedure Laterality Date  . ABDOMINAL HYSTERECTOMY    . APPENDECTOMY    . BREAST SURGERY     lumpectomy  . TONSILLECTOMY       reports that she has never smoked. She has never used smokeless tobacco. She reports that she drinks alcohol. She reports that she does not use drugs.  No Known Allergies  No family history on file.   Prior to Admission medications   Medication Sig Start Date End Date Taking? Authorizing Provider  calcium carbonate (CALCIUM 600) 600 MG TABS tablet Take 600 mg by mouth every other day. 01/28/12  Yes [provider]  Cholecalciferol (VITAMIN D3) 1000 units CAPS Take 1,000 Units by mouth every other day. 03/01/13  Yes [provider]  docusate sodium (COLACE) 100 MG capsule Take 100 mg by mouth 2 (two) times daily. 01/28/12  Yes [provider]  glimepiride (AMARYL) 1 MG tablet Take 1 mg by mouth daily with breakfast.   Yes [provider]  levothyroxine  (SYNTHROID, LEVOTHROID) 50 MCG tablet Take 50 mcg by mouth daily before breakfast.   Yes [provider]  magnesium oxide (MAG-OX) 400 MG tablet Take 400 mg by mouth at bedtime.   Yes [provider]  Multiple Vitamins-Minerals (MULTIVITAMIN WOMEN PO) Take 1 tablet by mouth daily.   Yes [provider]    Physical Exam: Vitals:   07/13/17 0250 07/13/17 0251 07/13/17 0342 07/13/17 0459  BP: (!) 147/86 (!) 147/86 (!) 154/85 (!) 164/72  Pulse: 97 95 75 64  Resp:   16 16  Temp: 98.2 F (36.8 C)  98 F (36.7 C) 98.3 F (36.8 C)  TempSrc: Oral  Oral Oral  SpO2: 97% 95% 98% 99%  Weight:      Height:        Constitutional: NAD, calm, comfortable Eyes: PERRL, lids and conjunctivae normal ENMT: Mucous membranes are moist. Posterior pharynx clear of any exudate or lesions.Normal dentition.  Neck: normal, supple, no masses, no thyromegaly Respiratory: clear to auscultation bilaterally, no wheezing, no crackles. Normal respiratory effort. No accessory muscle use.  Cardiovascular: Regular rate and rhythm, no murmurs / rubs / gallops. No extremity edema. 2+ pedal pulses. No carotid bruits.  Abdomen: Mild distention and tenderness, no rebound no guarding. Musculoskeletal: no clubbing / cyanosis. No joint deformity upper and lower extremities. Good ROM, no contractures. Normal muscle tone.  Skin: no rashes,  lesions, ulcers. No induration Neurologic: CN 2-12 grossly intact. Sensation intact, DTR normal. Strength 5/5 in all 4.  Psychiatric: Normal judgment and insight. Alert and oriented x 3. Normal mood.    Labs on Admission: I have personally reviewed following labs and imaging studies  CBC: Recent Labs  Lab 07/13/17 0006  WBC 16.5*  HGB 12.7  HCT 37.5  MCV 98.7  PLT 774   Basic Metabolic Panel: Recent Labs  Lab 07/13/17 0006  NA 135  K 3.9  CL 99*  CO2 23  GLUCOSE 188*  BUN 18  CREATININE 0.95  CALCIUM 9.9   GFR: Estimated Creatinine Clearance:  45.1 mL/min (by C-G formula based on SCr of 0.95 mg/dL). Liver Function Tests: Recent Labs  Lab 07/13/17 0006  AST 24  ALT 18  ALKPHOS 86  BILITOT 0.4  PROT 7.7  ALBUMIN 4.6   Recent Labs  Lab 07/13/17 0006  LIPASE 28   No results for input(s): AMMONIA in the last 168 hours. Coagulation Profile: No results for input(s): INR, PROTIME in the last 168 hours. Cardiac Enzymes: No results for input(s): CKTOTAL, CKMB, CKMBINDEX, TROPONINI in the last 168 hours. BNP (last 3 results) No results for input(s): PROBNP in the last 8760 hours. HbA1C: No results for input(s): HGBA1C in the last 72 hours. CBG: Recent Labs  Lab 07/13/17 0354  GLUCAP 191*   Lipid Profile: No results for input(s): CHOL, HDL, LDLCALC, TRIG, CHOLHDL, LDLDIRECT in the last 72 hours. Thyroid Function Tests: No results for input(s): TSH, T4TOTAL, FREET4, T3FREE, THYROIDAB in the last 72 hours. Anemia Panel: No results for input(s): VITAMINB12, FOLATE, FERRITIN, TIBC, IRON, RETICCTPCT in the last 72 hours. Urine analysis:    Component Value Date/Time   COLORURINE YELLOW 07/13/2017 0229   APPEARANCEUR CLOUDY (A) 07/13/2017 0229   LABSPEC <1.005 (L) 07/13/2017 0229   PHURINE 7.5 07/13/2017 0229   GLUCOSEU NEGATIVE 07/13/2017 0229   HGBUR SMALL (A) 07/13/2017 0229   BILIRUBINUR NEGATIVE 07/13/2017 0229   KETONESUR NEGATIVE 07/13/2017 0229   PROTEINUR NEGATIVE 07/13/2017 0229   NITRITE NEGATIVE 07/13/2017 0229   LEUKOCYTESUR MODERATE (A) 07/13/2017 0229    Radiological Exams on Admission: Ct Abdomen Pelvis W Contrast  Result Date: 07/13/2017 CLINICAL DATA:  Acute abdominal pain. Vomiting. History of bowel obstruction. EXAM: CT ABDOMEN AND PELVIS WITH CONTRAST TECHNIQUE: Multidetector CT imaging of the abdomen and pelvis was performed using the standard protocol following bolus administration of intravenous contrast. CONTRAST:  178mL ISOVUE-300 IOPAMIDOL (ISOVUE-300) INJECTION 61% COMPARISON:  CT  10/19/2016 FINDINGS: Lower chest: Minimal hypoventilatory atelectasis. No pleural fluid or consolidation. Hepatobiliary: Decreased hepatic density suggests steatosis. No discrete focal lesion. Prominent size liver spanning 21 cm cranial caudal. Gallbladder physiologically distended, no calcified stone. No biliary dilatation. Pancreas: No ductal dilatation or inflammation. Spleen: Normal in size without focal abnormality. Adrenals/Urinary Tract: 15 mm low-density right adrenal nodule is unchanged from prior exam. The left adrenal gland is normal. No hydronephrosis or perinephric edema. Homogeneous renal enhancement with symmetric excretion on delayed phase imaging. Urinary bladder is partially distended without wall thickening. Stomach/Bowel: Fluid-filled partially distended stomach. Small duodenal diverticulum. Dilated fluid-filled small bowel with fecalization of contents and transition point in the central abdomen, best appreciated on coronal image 36. More distal small bowel is decompressed. Minimal central mesenteric edema without pneumatosis or perforation. Moderate volume of stool throughout the colon. Appendix not visualized. Sigmoid diverticulosis without diverticulitis. Vascular/Lymphatic: Aorto bi-iliac atherosclerosis without aneurysm. No enlarged abdominal or pelvic lymph nodes. Reproductive: Status post  hysterectomy. No adnexal masses. Other: Trace mesenteric edema and free fluid in the region of bowel obstruction. No free air or intra-abdominal abscess. Postsurgical change of the anterior abdominal wall. Musculoskeletal: There are no acute or suspicious osseous abnormalities. IMPRESSION: 1. Small bowel obstruction with transition point in the central abdomen, likely adhesions. No perforation. 2. Sigmoid diverticulosis without diverticulitis. 3. Hepatic steatosis. 4.  Aortic Atherosclerosis (ICD10-I70.0). 5. Stable 1mm right adrenal nodule likely adenoma. Electronically Signed   By: Jeb Levering  M.D.   On: 07/13/2017 01:28    EKG: Independently reviewed.  Assessment/Plan Principal Problem:   SBO (small bowel obstruction) (HCC) Active Problems:   Type 2 diabetes mellitus without complication (HCC)   Hypothyroidism    1. SBO vs PSBO - 1. NPO 2. IVF 3. zofran PRN nausea 4. Morphine PRN pain 5. Will hold off on NGT for the moment: until she develops severe uncontrolled pain, uncontrolled N/V, or until gen surg wants this when my day team colleagues call them for routine consult this morning. 6. Call gen surg in AM\ 2. DM2 - 1. Sensitive SSI Q4H 3. Hypothyroidism - convert synthroid to IV  DVT prophylaxis: Lovenox Code Status: Full Family Communication: No family in room Disposition Plan: Home after admit Consults called: None Admission status: Admit to inpatient   Etta Quill DO [facility] Pager [phone]  If 7AM-7PM, please contact day team taking care of patient [email or web address] Password [email or web address]  07/13/2017, 6:03 AM

## 2017-07-13 NOTE — ED Notes (Signed)
Pt returned from CT °

## 2017-07-13 NOTE — ED Notes (Signed)
Patient had small formed BM and is vomiting yellow emesis. IV Zofran given per order.

## 2017-07-13 NOTE — ED Provider Notes (Signed)
[facility] EMERGENCY DEPARTMENT Provider Note   CSN: [id number] Arrival date & time: 07/12/17  2335     History   Chief Complaint Chief Complaint  Patient presents with  . Abdominal Pain    HPI Naiah Donahoe is a 84 y.o. female.  HPI   Patient is an 84 year old female presenting with abdominal pain and vomiting.  Patient has has history of small bowel obstruction was treated conservatively in July.  Patient says her symptoms feel similarly.  At around 5 PM at night she also had acute onset abdominal pain with vomiting.  She appear feels much better now.  She has not vomited since arrival.  No fevers.  No recent illness.  Patient has not passed any gas since onset of symptoms.  Past Medical History:  Diagnosis Date  . Bowel obstruction ([place])   . Cancer [facility])    right breast cancer  . Thyroid disease     Patient Active Problem List   Diagnosis Date Noted  . Small bowel obstruction ([place]) 10/19/2016  . Partial small bowel obstruction ([place]) 10/19/2016  . Type 2 diabetes mellitus without complication ([place]) 08/65/7846  . Hypothyroidism 10/19/2016  . IBS (irritable bowel syndrome) 10/19/2016  . History of breast cancer 10/19/2016  . History of uterine cancer 10/19/2016    Past Surgical History:  Procedure Laterality Date  . ABDOMINAL HYSTERECTOMY    . APPENDECTOMY    . BREAST SURGERY     lumpectomy  . TONSILLECTOMY       OB History   None      Home Medications    Prior to Admission medications   Medication Sig Start Date End Date Taking? Authorizing Provider  calcium carbonate (CALCIUM 600) 600 MG TABS tablet Take 600 mg by mouth every other day. 01/28/12   [provider]  Cholecalciferol (VITAMIN D3) 1000 units CAPS Take 1,000 Units by mouth every other day. 03/01/13   [provider]  docusate sodium (COLACE) 100 MG capsule Take 100 mg by mouth 2 (two) times daily. 01/28/12   [provider]  glimepiride (AMARYL) 1 MG  tablet Take 1 mg by mouth daily with breakfast.    [provider]  levothyroxine (SYNTHROID, LEVOTHROID) 50 MCG tablet Take 50 mcg by mouth daily before breakfast.    [provider]  Multiple Vitamins-Minerals (MULTIVITAMIN WOMEN PO) Take 1 tablet by mouth daily.    [provider]    Family History No family history on file.  Social History Social History   Tobacco Use  . Smoking status: Never Smoker  . Smokeless tobacco: Never Used  Substance Use Topics  . Alcohol use: Yes    Comment: some days  . Drug use: No     Allergies   Patient has no known allergies.   Review of Systems Review of Systems  Constitutional: Negative for activity change and fever.  Respiratory: Negative for shortness of breath.   Cardiovascular: Negative for chest pain.  Gastrointestinal: Positive for abdominal pain and vomiting. Negative for diarrhea and nausea.  All other systems reviewed and are negative.    Physical Exam Updated Vital Signs BP (!) 154/82 (BP Location: Left Arm)   Pulse 83   Temp 97.8 F (36.6 C) (Oral)   Resp 16   Ht 5\' 5"  (1.651 m)   Wt 73.5 kg (162 lb)   SpO2 100%   BMI 26.96 kg/m   Physical Exam  Constitutional: She is oriented to person, place, and time. She  appears well-developed and well-nourished.  HENT:  Head: Normocephalic and atraumatic.  Eyes: Right eye exhibits no discharge.  Cardiovascular: Normal rate.  Pulmonary/Chest: Effort normal.  Abdominal: She exhibits distension. There is tenderness.  Neurological: She is oriented to person, place, and time.  Skin: Skin is warm and dry. She is not diaphoretic.  Psychiatric: She has a normal mood and affect.  Nursing note and vitals reviewed.    ED Treatments / Results  Labs (all labs ordered are listed, but only abnormal results are displayed) Labs Reviewed  COMPREHENSIVE METABOLIC PANEL - Abnormal; Notable for the following components:      Result Value   Chloride 99 (*)     Glucose, Bld 188 (*)    GFR calc non Af Amer 54 (*)    All other components within normal limits  CBC - Abnormal; Notable for the following components:   WBC 16.5 (*)    RBC 3.80 (*)    All other components within normal limits  LIPASE, BLOOD  URINALYSIS, ROUTINE W REFLEX MICROSCOPIC    EKG None  Radiology Ct Abdomen Pelvis W Contrast  Result Date: 07/13/2017 CLINICAL DATA:  Acute abdominal pain. Vomiting. History of bowel obstruction. EXAM: CT ABDOMEN AND PELVIS WITH CONTRAST TECHNIQUE: Multidetector CT imaging of the abdomen and pelvis was performed using the standard protocol following bolus administration of intravenous contrast. CONTRAST:  165mL ISOVUE-300 IOPAMIDOL (ISOVUE-300) INJECTION 61% COMPARISON:  CT 10/19/2016 FINDINGS: Lower chest: Minimal hypoventilatory atelectasis. No pleural fluid or consolidation. Hepatobiliary: Decreased hepatic density suggests steatosis. No discrete focal lesion. Prominent size liver spanning 21 cm cranial caudal. Gallbladder physiologically distended, no calcified stone. No biliary dilatation. Pancreas: No ductal dilatation or inflammation. Spleen: Normal in size without focal abnormality. Adrenals/Urinary Tract: 15 mm low-density right adrenal nodule is unchanged from prior exam. The left adrenal gland is normal. No hydronephrosis or perinephric edema. Homogeneous renal enhancement with symmetric excretion on delayed phase imaging. Urinary bladder is partially distended without wall thickening. Stomach/Bowel: Fluid-filled partially distended stomach. Small duodenal diverticulum. Dilated fluid-filled small bowel with fecalization of contents and transition point in the central abdomen, best appreciated on coronal image 36. More distal small bowel is decompressed. Minimal central mesenteric edema without pneumatosis or perforation. Moderate volume of stool throughout the colon. Appendix not visualized. Sigmoid diverticulosis without diverticulitis.  Vascular/Lymphatic: Aorto bi-iliac atherosclerosis without aneurysm. No enlarged abdominal or pelvic lymph nodes. Reproductive: Status post hysterectomy. No adnexal masses. Other: Trace mesenteric edema and free fluid in the region of bowel obstruction. No free air or intra-abdominal abscess. Postsurgical change of the anterior abdominal wall. Musculoskeletal: There are no acute or suspicious osseous abnormalities. IMPRESSION: 1. Small bowel obstruction with transition point in the central abdomen, likely adhesions. No perforation. 2. Sigmoid diverticulosis without diverticulitis. 3. Hepatic steatosis. 4.  Aortic Atherosclerosis (ICD10-I70.0). 5. Stable 23mm right adrenal nodule likely adenoma. Electronically Signed   By: Jeb Levering M.D.   On: 07/13/2017 01:28    Procedures Procedures (including critical care time)  Medications Ordered in ED Medications  iopamidol (ISOVUE-300) 61 % injection 100 mL (100 mLs Intravenous Contrast Given 07/13/17 0043)     Initial Impression / Assessment and Plan / ED Course  I have reviewed the triage vital signs and the nursing notes.  Pertinent labs & imaging results that were available during my care of the patient were reviewed by me and considered in my medical decision making (see chart for details).    Patient is an 84 year old female presenting with abdominal  pain and vomiting.  Patient has has history of small bowel obstruction was treated conservatively in July.  Patient says her symptoms feel similarly.  At around 5 PM at night she also had acute onset abdominal pain with vomiting.  She appear feels much better now.  She has not vomited since arrival.  No fevers.  No recent illness.  Patient has not passed any gas since onset of symptoms.  1:58 AM CT shows small bowel obstruction.  Patient had previous admission in July for the same thing treated conservatively and improved.  Patient appears very comfortable exam.  Will admit to hospitalist it was  a long period  See no need to urgent emergently contact surgery right now, I think conservative approach is likely a right approach and hospitalist can consult surgery in the morning or if the situation changes.    Final Clinical Impressions(s) / ED Diagnoses   Final diagnoses:  None    ED Discharge Orders    None       Pattie Flaharty, Fredia Sorrow, MD 07/13/17 [phone]

## 2017-07-13 NOTE — Consult Note (Signed)
Reason for Consult: Abdominal pain, small bowel obstruction Referring Physician: [name] is an 84 y.o. female.  HPI: Patient is a very pleasant 84 year old female who was in her usual state of good health until yesterday evening, now about 36 hours ago.  At that time she developed the onset of intermittent sharp and crampy upper and mid abdominal pain radiating around both flanks to her back.  She soon became nauseated and vomited several times.  She presented to urgent care and was referred and subsequent CT scan obtained below showing evidence of small bowel obstruction.  Patient has a remote history of appendectomy and hysterectomy.  Over 20 years ago she developed a bowel obstruction and underwent laparotomy.  She did well until last summer when she was admitted with bowel obstruction that resolved nonoperatively.  She has not had any recurrent symptoms until now.  She did have a bowel movement last night.  Since admission she is feeling better but still having some crampy pain and no nausea or vomiting.  She has not had any bowel movements or flatus since last night.  Her abdomen feels distended.  No fever or chills.  No urinary symptoms.  Past Medical History:  Diagnosis Date  . Bowel obstruction ([place])   . Cancer [facility])    right breast cancer  . Thyroid disease     Past Surgical History:  Procedure Laterality Date  . ABDOMINAL HYSTERECTOMY    . APPENDECTOMY    . BREAST SURGERY     lumpectomy  . TONSILLECTOMY      No family history on file.  Social History:  reports that she has never smoked. She has never used smokeless tobacco. She reports that she drinks alcohol. She reports that she does not use drugs.  Allergies: No Known Allergies  Current Facility-Administered Medications  Medication Dose Route Frequency Provider Last Rate Last Dose  . 0.9 %  sodium chloride infusion   Intravenous Continuous Etta Quill, DO 100 mL/hr at 07/13/17 0240    . acetaminophen  (TYLENOL) tablet 650 mg  650 mg Oral Q6H PRN Etta Quill, DO       Or  . acetaminophen (TYLENOL) suppository 650 mg  650 mg Rectal Q6H PRN Etta Quill, DO      . diatrizoate meglumine-sodium (GASTROGRAFIN) 66-10 % solution 90 mL  90 mL Per NG tube Once Excell Seltzer, MD      . enoxaparin (LOVENOX) injection 40 mg  40 mg Subcutaneous Q24H Alcario Drought, Jared M, DO      . insulin aspart (novoLOG) injection 0-9 Units  0-9 Units Subcutaneous Q4H Etta Quill, DO   2 Units at 07/13/17 0402  . levothyroxine (SYNTHROID, LEVOTHROID) injection 25 mcg  25 mcg Intravenous Daily Jennette Kettle M, DO   25 mcg at 07/13/17 1052  . morphine 2 MG/ML injection 2 mg  2 mg Intravenous Q4H PRN Arby Barrette A, NP   2 mg at 07/13/17 0452  . ondansetron (ZOFRAN) tablet 4 mg  4 mg Oral Q6H PRN Etta Quill, DO       Or  . ondansetron [facility]) injection 4 mg  4 mg Intravenous Q6H PRN Etta Quill, DO         Results for orders placed or performed during the hospital encounter of 07/12/17 (from the past 48 hour(s))  Lipase, blood     Status: None   Collection Time: 07/13/17 12:06 AM  Result Value Ref Range   Lipase  28 11 - 51 U/L    Comment: Performed at [facility], [address] [place], [place] [address]  Comprehensive metabolic panel     Status: Abnormal   Collection Time: 07/13/17 12:06 AM  Result Value Ref Range   Sodium 135 135 - 145 mmol/L   Potassium 3.9 3.5 - 5.1 mmol/L   Chloride 99 (L) 101 - 111 mmol/L   CO2 23 22 - 32 mmol/L   Glucose, Bld 188 (H) 65 - 99 mg/dL   BUN 18 6 - 20 mg/dL   Creatinine, Ser 0.95 0.44 - 1.00 mg/dL   Calcium 9.9 8.9 - 10.3 mg/dL   Total Protein 7.7 6.5 - 8.1 g/dL   Albumin 4.6 3.5 - 5.0 g/dL   AST 24 15 - 41 U/L   ALT 18 14 - 54 U/L   Alkaline Phosphatase 86 38 - 126 U/L   Total Bilirubin 0.4 0.3 - 1.2 mg/dL   GFR calc non Af Amer 54 (L) >60 mL/min   GFR calc Af Amer >60 >60 mL/min    Comment: (NOTE) The eGFR has been  calculated using the CKD EPI equation. This calculation has not been validated in all clinical situations. eGFR's persistently <60 mL/min signify possible Chronic Kidney Disease.    Anion gap 13 5 - 15    Comment: Performed at [facility], [address] [place], [place] [address]  CBC     Status: Abnormal   Collection Time: 07/13/17 12:06 AM  Result Value Ref Range   WBC 16.5 (H) 4.0 - 10.5 K/uL   RBC 3.80 (L) 3.87 - 5.11 MIL/uL   Hemoglobin 12.7 12.0 - 15.0 g/dL   HCT 37.5 36.0 - 46.0 %   MCV 98.7 78.0 - 100.0 fL   MCH 33.4 26.0 - 34.0 pg   MCHC 33.9 30.0 - 36.0 g/dL   RDW 11.8 11.5 - 15.5 %   Platelets 284 150 - 400 K/uL    Comment: Performed at [facility], [address] [place], [place] [address]  Urinalysis, Routine w reflex microscopic     Status: Abnormal   Collection Time: 07/13/17  2:29 AM  Result Value Ref Range   Color, Urine YELLOW YELLOW   APPearance CLOUDY (A) CLEAR   Specific Gravity, Urine <1.005 (L) 1.005 - 1.030   pH 7.5 5.0 - 8.0   Glucose, UA NEGATIVE NEGATIVE mg/dL   Hgb urine dipstick SMALL (A) NEGATIVE   Bilirubin Urine NEGATIVE NEGATIVE   Ketones, ur NEGATIVE NEGATIVE mg/dL   Protein, ur NEGATIVE NEGATIVE mg/dL   Nitrite NEGATIVE NEGATIVE   Leukocytes, UA MODERATE (A) NEGATIVE    Comment: Performed at [facility], [address] [place], [place] [address]  Urinalysis, Microscopic (reflex)     Status: Abnormal   Collection Time: 07/13/17  2:29 AM  Result Value Ref Range   RBC / HPF 0-5 0 - 5 RBC/hpf   WBC, UA 6-30 0 - 5 WBC/hpf   Bacteria, UA FEW (A) NONE SEEN   Squamous Epithelial / LPF 6-30 (A) NONE SEEN   Amorphous Crystal PRESENT     Comment: Performed at [facility], [address] [place], [place] [address]  Glucose, capillary     Status: Abnormal   Collection Time: 07/13/17  3:54 AM  Result Value Ref Range   Glucose-Capillary 191 (H) 65 - 99 mg/dL  Glucose, capillary     Status:  Abnormal   Collection Time: 07/13/17  7:49 AM  Result Value Ref Range   Glucose-Capillary 128 (H) 65 - 99 mg/dL    Ct Abdomen Pelvis W Contrast  Result Date: 07/13/2017 CLINICAL DATA:  Acute abdominal pain. Vomiting. History of bowel obstruction. EXAM: CT ABDOMEN AND PELVIS WITH CONTRAST TECHNIQUE: Multidetector CT imaging of the abdomen and pelvis was performed using the standard protocol following bolus administration of intravenous contrast. CONTRAST:  129m ISOVUE-300 IOPAMIDOL (ISOVUE-300) INJECTION 61% COMPARISON:  CT 10/19/2016 FINDINGS: Lower chest: Minimal hypoventilatory atelectasis. No pleural fluid or consolidation. Hepatobiliary: Decreased hepatic density suggests steatosis. No discrete focal lesion. Prominent size liver spanning 21 cm cranial caudal. Gallbladder physiologically distended, no calcified stone. No biliary dilatation. Pancreas: No ductal dilatation or inflammation. Spleen: Normal in size without focal abnormality. Adrenals/Urinary Tract: 15 mm low-density right adrenal nodule is unchanged from prior exam. The left adrenal gland is normal. No hydronephrosis or perinephric edema. Homogeneous renal enhancement with symmetric excretion on delayed phase imaging. Urinary bladder is partially distended without wall thickening. Stomach/Bowel: Fluid-filled partially distended stomach. Small duodenal diverticulum. Dilated fluid-filled small bowel with fecalization of contents and transition point in the central abdomen, best appreciated on coronal image 36. More distal small bowel is decompressed. Minimal central mesenteric edema without pneumatosis or perforation. Moderate volume of stool throughout the colon. Appendix not visualized. Sigmoid diverticulosis without diverticulitis. Vascular/Lymphatic: Aorto bi-iliac atherosclerosis without aneurysm. No enlarged abdominal or pelvic lymph nodes. Reproductive: Status post hysterectomy. No adnexal masses. Other: Trace mesenteric edema and free  fluid in the region of bowel obstruction. No free air or intra-abdominal abscess. Postsurgical change of the anterior abdominal wall. Musculoskeletal: There are no acute or suspicious osseous abnormalities. IMPRESSION: 1. Small bowel obstruction with transition point in the central abdomen, likely adhesions. No perforation. 2. Sigmoid diverticulosis without diverticulitis. 3. Hepatic steatosis. 4.  Aortic Atherosclerosis (ICD10-I70.0). 5. Stable 112mright adrenal nodule likely adenoma. Electronically Signed   By: MeJeb Levering.D.   On: 07/13/2017 01:28    Review of Systems  Constitutional: Negative for chills and fever.  Respiratory: Negative.   Cardiovascular: Negative.   Gastrointestinal: Positive for abdominal pain, nausea and vomiting. Negative for blood in stool, constipation, diarrhea and melena.  Genitourinary: Negative.    Blood pressure (!) 164/72, pulse 64, temperature 98.3 F (36.8 C), temperature source Oral, resp. rate 16, height _0  (1.651 m), weight 73.5 kg (162 lb), SpO2 99 %. Physical Exam General: Alert, pleasant, well-developed Caucasian female, in no distress Skin: Warm and dry without rash or infection. HEENT: No palpable masses or thyromegaly. Sclera nonicteric. Pupils equal round and reactive. . Lymph nodes: No cervical, supraclavicular, or inguinal nodes palpable. Lungs: Breath sounds clear and equal without increased work of breathing Cardiovascular: Regular rate and rhythm without murmur. No JVD or edema. Peripheral pulses intact. Abdomen: Moderately distended.  Bowel sounds present.  Well-healed low midline incision.. Soft and nontender. No masses palpable. No organomegaly. No palpable hernias. Extremities: No edema or joint swelling or deformity. No chronic venous stasis changes. Neurologic: Alert and fully oriented.  Affect normal.  No gross motor deficits.  Assessment/Plan: [age]ear old female with previous history of small bowel obstruction presents with  typical symptoms and physical findings for acute small bowel obstruction.  I have personally reviewed her imaging which shows a transition point in the mid abdomen with some fecalization.  No evidence of complicating factors on CT or exam.  Very likely secondary to adhesions.  Recommend initial nonoperative management.  NG  tube will be placed and small bowel protocol started.  Discussed with the patient and all her questions answered.  We will follow closely.  Kendra Ortega 07/13/2017, 10:51 AM

## 2017-07-14 ENCOUNTER — Inpatient Hospital Stay (HOSPITAL_COMMUNITY): Payer: Medicare Other

## 2017-07-14 LAB — CBC WITH DIFFERENTIAL/PLATELET
BASOS ABS: 0 10*3/uL (ref 0.0–0.1)
Basophils Relative: 0 %
Eosinophils Absolute: 0.1 10*3/uL (ref 0.0–0.7)
Eosinophils Relative: 1 %
HEMATOCRIT: 40.2 % (ref 36.0–46.0)
HEMOGLOBIN: 12.8 g/dL (ref 12.0–15.0)
Lymphocytes Relative: 19 %
Lymphs Abs: 2 10*3/uL (ref 0.7–4.0)
MCH: 33.1 pg (ref 26.0–34.0)
MCHC: 31.8 g/dL (ref 30.0–36.0)
MCV: 103.9 fL — ABNORMAL HIGH (ref 78.0–100.0)
MONOS PCT: 11 %
Monocytes Absolute: 1.2 10*3/uL — ABNORMAL HIGH (ref 0.1–1.0)
NEUTROS ABS: 7.1 10*3/uL (ref 1.7–7.7)
NEUTROS PCT: 69 %
Platelets: 280 10*3/uL (ref 150–400)
RBC: 3.87 MIL/uL (ref 3.87–5.11)
RDW: 12.7 % (ref 11.5–15.5)
WBC: 10.3 10*3/uL (ref 4.0–10.5)

## 2017-07-14 LAB — BASIC METABOLIC PANEL
ANION GAP: 10 (ref 5–15)
BUN: 16 mg/dL (ref 6–20)
CHLORIDE: 103 mmol/L (ref 101–111)
CO2: 29 mmol/L (ref 22–32)
Calcium: 9.3 mg/dL (ref 8.9–10.3)
Creatinine, Ser: 0.96 mg/dL (ref 0.44–1.00)
GFR calc Af Amer: >60 mL/min (ref >60)
GFR calc non Af Amer: 53 mL/min — ABNORMAL LOW (ref >60)
GLUCOSE: 126 mg/dL — AB (ref 65–99)
POTASSIUM: 4.1 mmol/L (ref 3.5–5.1)
Sodium: 142 mmol/L (ref 135–145)

## 2017-07-14 LAB — GLUCOSE, CAPILLARY
GLUCOSE-CAPILLARY: 119 mg/dL — AB (ref 65–99)
GLUCOSE-CAPILLARY: 95 mg/dL (ref 65–99)
GLUCOSE-CAPILLARY: 131 mg/dL — AB (ref 65–99)
Glucose-Capillary: 124 mg/dL — ABNORMAL HIGH (ref 65–99)
Glucose-Capillary: 134 mg/dL — ABNORMAL HIGH (ref 65–99)
Glucose-Capillary: 139 mg/dL — ABNORMAL HIGH (ref 65–99)

## 2017-07-14 LAB — MAGNESIUM: Magnesium: 2.5 mg/dL — ABNORMAL HIGH (ref 1.7–2.4)

## 2017-07-14 LAB — URINE CULTURE: Culture: NO GROWTH

## 2017-07-14 MED ORDER — MAGNESIUM SULFATE 2 GM/50ML IV SOLN: 2.0000 g | Freq: Once | INTRAVENOUS | Status: DC

## 2017-07-14 MED ORDER — GLYCERIN (LAXATIVE) 2.1 G RE SUPP
1.0000 | Freq: Once | RECTAL | Status: AC
  Administered 2017-07-14: 1 via RECTAL
  Filled 2017-07-14: qty 1

## 2017-07-14 NOTE — Progress Notes (Signed)
Patient ID: Kendra Ortega, female   DOB: 04-06-1932, 84 y.o.   MRN: [id number]       Subjective: Pt feels about the same.  NGT in place with about 1700cc so far.  No flatus or BM.  Feels like she needs to have a BM.  Objective: Vital signs in last 24 hours: Temp:  [97.9 F (36.6 C)-99.4 F (37.4 C)] 97.9 F (36.6 C) (03/25 0452) Pulse Rate:  [78-87] 81 (03/25 0452) Resp:  [14-18] 18 (03/25 0452) BP: (136-155)/(66-84) 146/84 (03/25 0452) SpO2:  [92 %-99 %] 92 % (03/25 0452) Last BM Date: 07/13/17  Intake/Output from previous day: 03/24 0701 - 03/25 0700 In: 2346.7 [I.V.:2346.7] Out: 2100 [Urine:400; Emesis/NG output:1700] Intake/Output this shift: Total I/O In: 0  Out: 300 [Urine:300]  PE: Heart: regular Lungs: CTAB Abd: soft, NT, but does feel somewhat distended, specifically in her lower abdomen.  Few BS.  NGT in place with bilious output.  [place] just changed.  Lab Results:  Recent Labs    07/13/17 0006 07/14/17 0834  WBC 16.5* 10.3  HGB 12.7 12.8  HCT 37.5 40.2  PLT 284 280   BMET Recent Labs    07/13/17 0006 07/14/17 0834  NA 135 142  K 3.9 4.1  CL 99* 103  CO2 23 29  GLUCOSE 188* 126*  BUN 18 16  CREATININE 0.95 0.96  CALCIUM 9.9 9.3   PT/INR No results for input(s): LABPROT, INR in the last 72 hours. CMP     Component Value Date/Time   NA 142 07/14/2017 0834   K 4.1 07/14/2017 0834   CL 103 07/14/2017 0834   CO2 29 07/14/2017 0834   GLUCOSE 126 (H) 07/14/2017 0834   BUN 16 07/14/2017 0834   CREATININE 0.96 07/14/2017 0834   CALCIUM 9.3 07/14/2017 0834   PROT 7.7 07/13/2017 0006   ALBUMIN 4.6 07/13/2017 0006   AST 24 07/13/2017 0006   ALT 18 07/13/2017 0006   ALKPHOS 86 07/13/2017 0006   BILITOT 0.4 07/13/2017 0006   GFRNONAA 53 (L) 07/14/2017 0834   GFRAA >60 07/14/2017 0834   Lipase     Component Value Date/Time   LIPASE 28 07/13/2017 0006       Studies/Results: Ct Abdomen Pelvis W Contrast  Result Date:  07/13/2017 CLINICAL DATA:  Acute abdominal pain. Vomiting. History of bowel obstruction. EXAM: CT ABDOMEN AND PELVIS WITH CONTRAST TECHNIQUE: Multidetector CT imaging of the abdomen and pelvis was performed using the standard protocol following bolus administration of intravenous contrast. CONTRAST:  155mL ISOVUE-300 IOPAMIDOL (ISOVUE-300) INJECTION 61% COMPARISON:  CT 10/19/2016 FINDINGS: Lower chest: Minimal hypoventilatory atelectasis. No pleural fluid or consolidation. Hepatobiliary: Decreased hepatic density suggests steatosis. No discrete focal lesion. Prominent size liver spanning 21 cm cranial caudal. Gallbladder physiologically distended, no calcified stone. No biliary dilatation. Pancreas: No ductal dilatation or inflammation. Spleen: Normal in size without focal abnormality. Adrenals/Urinary Tract: 15 mm low-density right adrenal nodule is unchanged from prior exam. The left adrenal gland is normal. No hydronephrosis or perinephric edema. Homogeneous renal enhancement with symmetric excretion on delayed phase imaging. Urinary bladder is partially distended without wall thickening. Stomach/Bowel: Fluid-filled partially distended stomach. Small duodenal diverticulum. Dilated fluid-filled small bowel with fecalization of contents and transition point in the central abdomen, best appreciated on coronal image 36. More distal small bowel is decompressed. Minimal central mesenteric edema without pneumatosis or perforation. Moderate volume of stool throughout the colon. Appendix not visualized. Sigmoid diverticulosis without diverticulitis. Vascular/Lymphatic: Aorto bi-iliac atherosclerosis without aneurysm. No  enlarged abdominal or pelvic lymph nodes. Reproductive: Status post hysterectomy. No adnexal masses. Other: Trace mesenteric edema and free fluid in the region of bowel obstruction. No free air or intra-abdominal abscess. Postsurgical change of the anterior abdominal wall. Musculoskeletal: There are no  acute or suspicious osseous abnormalities. IMPRESSION: 1. Small bowel obstruction with transition point in the central abdomen, likely adhesions. No perforation. 2. Sigmoid diverticulosis without diverticulitis. 3. Hepatic steatosis. 4.  Aortic Atherosclerosis (ICD10-I70.0). 5. Stable 24mm right adrenal nodule likely adenoma. Electronically Signed   By: Jeb Levering M.D.   On: 07/13/2017 01:28   Dg Abd Portable 1v  Result Date: 07/14/2017 CLINICAL DATA:  Followup small bowel obstruction. EXAM: PORTABLE ABDOMEN - 1 VIEW COMPARISON:  07/14/2017 FINDINGS: Enteric tube tip is in the stomach with side port below GE junction. There are persistent dilated loops of small bowel within the left abdomen. The degree of small-bowel dilatation is stable to slightly improved in the interval. Antegrade progression of enteric contrast material from left upper quadrant dilated small bowel loops noted. No abnormal large bowel dilatation. There is a moderate stool burden throughout the colon. IMPRESSION: 1. Partial small bowel obstruction unchanged to slightly improved in the interval. Electronically Signed   By: Kerby Moors M.D.   On: 07/14/2017 10:25   Dg Abd Portable 1v-small Bowel Obstruction Protocol-initial, 8 Hr Delay  Result Date: 07/14/2017 CLINICAL DATA:  84 year old female with small bowel obstruction. 8 hour delayed film following oral contrast administered by NG tube. EXAM: PORTABLE ABDOMEN - 1 VIEW COMPARISON:  07/13/2017 radiographs and CT Abdomen and Pelvis FINDINGS: Portable KUB at 0054 hours. The administered oral contrast is within dilated small bowel loops in the left abdomen and pelvis. No contrast identified in the distal small bowel or colon. Small volume of residual contrast in the stomach. Excreted IV contrast in the urinary bladder. Stable enteric tube, side hole at the level of the gastric body. Sequelae of ventral abdominal hernia repair with mesh. No acute osseous abnormality identified.  IMPRESSION: 1. Persistent small bowel obstruction. Enteric contrast on this 8 hour post film is within the dilated small bowel loops. 2. Stable enteric tube. Electronically Signed   By: Genevie Ann M.D.   On: 07/14/2017 01:56   Dg Abd Portable 1v-small Bowel Protocol-position Verification  Result Date: 07/13/2017 CLINICAL DATA:  Nasogastric tube placement. EXAM: PORTABLE ABDOMEN - 1 VIEW COMPARISON:  10/23/2016 and abdomen pelvis CT dated 07/13/2017. FINDINGS: Nasogastric tube tip in the region of the gastric pylorus. Single mildly dilated loop of small bowel in the mid to lower abdomen. Gas and stool in normal caliber colon. Excreted contrast in the urinary bladder. Mild scoliosis and mild lumbar spine degenerative changes. Hernia repair mesh at the level of the upper pelvis. IMPRESSION: 1. Nasogastric tube tip in the region of the gastric pylorus. 2. Single mildly dilated loop of small bowel visible in the mid to lower abdomen. Electronically Signed   By: Claudie Revering M.D.   On: 07/13/2017 13:52    Anti-infectives: Anti-infectives (From admission, onward)   None       Assessment/Plan SBO, h/o multiple prior surgeries -SBO protocol in place.  Follow up 24 hrs films show PSBO with some air in colon.  Contrast has not definitively made it into the colon.  Significant stool burden is present.   -glycerin suppository today to help move some stool on through to see if this will help her overall  -repeat abdominal x-rays in the morning.   -patient  has not resolved at this point, but does appear to have some air in her colon more consistent with a partial obstruction.  Will follow films and see if she begins to improve without surgical intervention.  FEN - NPO/IVFs VTE - Lovenox, SCDs ID - none  Dispo - cont NGT, follow up films in the morning, glycerin suppository today.  mobilize   LOS: 1 day    Henreitta Cea , [facility] 07/14/2017, 10:58 AM Pager:  [phone] Consults: [phone] Mon-Fri 7:00 am-4:30 pm Sat-Sun 7:00 am-11:30 am

## 2017-07-14 NOTE — Progress Notes (Signed)
Follow up: Patient returned 1/2 glycerin suppository with no stool seen. Donne Hazel, RN

## 2017-07-14 NOTE — Progress Notes (Signed)
PROGRESS NOTE [facility]   Kendra Ortega   YNW:[id number] DOB: [birth year]  DOA: 07/12/2017 PCP: Javier Glazier, MD   Brief Narrative:  Kendra Ortega is a 84 year old female with significant history of recurrent SBO's and hypothyroidism presented to the emergency department complaining of abdominal pain, nausea and vomiting.  Upon ED evaluation abdomen was found to be distended, CT abdomen and pelvis showed SBO.  Patient was admitted with working diagnosis of small bowel obstruction, surgery was consulted and recommended conservative treatment with NG tube and IV fluids.  Subjective: Patient seen and examined, denies flatus no bowel movement. No abdominal pain.  No acute events overnight.  NG tube with high output  Assessment & Plan: SBO Conservative treatment, NG tube to intermittent suction, continue IV fluids, ambulate. General surgery following, recommending suppository.  Abdominal x-ray with very  minimal improvement. Continue supportive treatment  Hypothyroidism Continue IV Synthroid  Diabetes mellitus type 2 Not on home medication SSI and monitor CBGs  DVT prophylaxis: Lovenox Code Status: Full code Family Communication: None at bedside Disposition Plan: Home when SBO resolved unable to tolerate diet   Consultants:   General surgery  Procedures:   None  Antimicrobials:  None   Objective: Vitals:   07/13/17 1416 07/13/17 2133 07/14/17 0452 07/14/17 1430  BP: (!) 155/77 136/66 (!) 146/84 (!) 150/77  Pulse: 87 78 81 74  Resp: 14 15 18 18   Temp: 98.5 F (36.9 C) 99.4 F (37.4 C) 97.9 F (36.6 C) 98.2 F (36.8 C)  TempSrc: Oral Oral Oral Oral  SpO2: 99% 94% 92% 96%  Weight:      Height:        Intake/Output Summary (Last 24 hours) at 07/14/2017 1845 Last data filed at 07/14/2017 1843 Gross per 24 hour  Intake 2400 ml  Output 2050 ml  Net 350 ml   Filed Weights   07/12/17 2341  Weight: 73.5 kg (162 lb)    Examination:  General: Pt  is alert, awake, not in acute distress Cardiovascular: RRR, S1/S2 +, no rubs, no gallops Respiratory: CTA bilaterally, no wheezing, no rhonchi Abdominal: Soft, mild distention, hypoactive bowel sounds. Extremities: no edema, no cyanosis  Data Reviewed: I have personally reviewed following labs and imaging studies  CBC: Recent Labs  Lab 07/13/17 0006 07/14/17 0834  WBC 16.5* 10.3  NEUTROABS  --  7.1  HGB 12.7 12.8  HCT 37.5 40.2  MCV 98.7 103.9*  PLT 284 657   Basic Metabolic Panel: Recent Labs  Lab 07/13/17 0006 07/14/17 0834  NA 135 142  K 3.9 4.1  CL 99* 103  CO2 23 29  GLUCOSE 188* 126*  BUN 18 16  CREATININE 0.95 0.96  CALCIUM 9.9 9.3  MG  --  2.5*   GFR: Estimated Creatinine Clearance: 44.6 mL/min (by C-G formula based on SCr of 0.96 mg/dL). Liver Function Tests: Recent Labs  Lab 07/13/17 0006  AST 24  ALT 18  ALKPHOS 86  BILITOT 0.4  PROT 7.7  ALBUMIN 4.6   Recent Labs  Lab 07/13/17 0006  LIPASE 28   No results for input(s): AMMONIA in the last 168 hours. Coagulation Profile: No results for input(s): INR, PROTIME in the last 168 hours. Cardiac Enzymes: No results for input(s): CKTOTAL, CKMB, CKMBINDEX, TROPONINI in the last 168 hours. BNP (last 3 results) No results for input(s): PROBNP in the last 8760 hours. HbA1C: No results for input(s): HGBA1C in the last 72 hours. CBG: Recent Labs  Lab 07/14/17 0022 07/14/17 8469  07/14/17 0717 07/14/17 1141 07/14/17 1652  GLUCAP 124* 139* 119* 134* 95   Lipid Profile: No results for input(s): CHOL, HDL, LDLCALC, TRIG, CHOLHDL, LDLDIRECT in the last 72 hours. Thyroid Function Tests: No results for input(s): TSH, T4TOTAL, FREET4, T3FREE, THYROIDAB in the last 72 hours. Anemia Panel: No results for input(s): VITAMINB12, FOLATE, FERRITIN, TIBC, IRON, RETICCTPCT in the last 72 hours. Sepsis Labs: No results for input(s): PROCALCITON, LATICACIDVEN in the last 168 hours.  Recent Results (from the  past 240 hour(s))  Culture, Urine     Status: None   Collection Time: 07/13/17  6:02 AM  Result Value Ref Range Status   Specimen Description   Final    URINE, RANDOM Performed at [facility] [address], [place], [place] [address]    Special Requests   Final    NONE Performed at [facility], [place] [address], [place], [place] [address]    Culture   Final    NO GROWTH Performed at [facility], [place] [address] [place], [place] [address]    Report Status 07/14/2017 FINAL  Final      Radiology Studies: Ct Abdomen Pelvis W Contrast  Result Date: 07/13/2017 CLINICAL DATA:  Acute abdominal pain. Vomiting. History of bowel obstruction. EXAM: CT ABDOMEN AND PELVIS WITH CONTRAST TECHNIQUE: Multidetector CT imaging of the abdomen and pelvis was performed using the standard protocol following bolus administration of intravenous contrast. CONTRAST:  174mL ISOVUE-300 IOPAMIDOL (ISOVUE-300) INJECTION 61% COMPARISON:  CT 10/19/2016 FINDINGS: Lower chest: Minimal hypoventilatory atelectasis. No pleural fluid or consolidation. Hepatobiliary: Decreased hepatic density suggests steatosis. No discrete focal lesion. Prominent size liver spanning 21 cm cranial caudal. Gallbladder physiologically distended, no calcified stone. No biliary dilatation. Pancreas: No ductal dilatation or inflammation. Spleen: Normal in size without focal abnormality. Adrenals/Urinary Tract: 15 mm low-density right adrenal nodule is unchanged from prior exam. The left adrenal gland is normal. No hydronephrosis or perinephric edema. Homogeneous renal enhancement with symmetric excretion on delayed phase imaging. Urinary bladder is partially distended without wall thickening. Stomach/Bowel: Fluid-filled partially distended stomach. Small duodenal diverticulum. Dilated fluid-filled small bowel with fecalization of contents and transition point in the central abdomen, best appreciated on  coronal image 36. More distal small bowel is decompressed. Minimal central mesenteric edema without pneumatosis or perforation. Moderate volume of stool throughout the colon. Appendix not visualized. Sigmoid diverticulosis without diverticulitis. Vascular/Lymphatic: Aorto bi-iliac atherosclerosis without aneurysm. No enlarged abdominal or pelvic lymph nodes. Reproductive: Status post hysterectomy. No adnexal masses. Other: Trace mesenteric edema and free fluid in the region of bowel obstruction. No free air or intra-abdominal abscess. Postsurgical change of the anterior abdominal wall. Musculoskeletal: There are no acute or suspicious osseous abnormalities. IMPRESSION: 1. Small bowel obstruction with transition point in the central abdomen, likely adhesions. No perforation. 2. Sigmoid diverticulosis without diverticulitis. 3. Hepatic steatosis. 4.  Aortic Atherosclerosis (ICD10-I70.0). 5. Stable 45mm right adrenal nodule likely adenoma. Electronically Signed   By: Jeb Levering M.D.   On: 07/13/2017 01:28   Dg Abd Portable 1v  Result Date: 07/14/2017 CLINICAL DATA:  Followup small bowel obstruction. EXAM: PORTABLE ABDOMEN - 1 VIEW COMPARISON:  07/14/2017 FINDINGS: Enteric tube tip is in the stomach with side port below GE junction. There are persistent dilated loops of small bowel within the left abdomen. The degree of small-bowel dilatation is stable to slightly improved in the interval. Antegrade progression of enteric contrast material from left upper quadrant dilated small bowel loops noted. No  abnormal large bowel dilatation. There is a moderate stool burden throughout the colon. IMPRESSION: 1. Partial small bowel obstruction unchanged to slightly improved in the interval. Electronically Signed   By: Kerby Moors M.D.   On: 07/14/2017 10:25   Dg Abd Portable 1v-small Bowel Obstruction Protocol-initial, 8 Hr Delay  Result Date: 07/14/2017 CLINICAL DATA:  84 year old female with small bowel  obstruction. 8 hour delayed film following oral contrast administered by NG tube. EXAM: PORTABLE ABDOMEN - 1 VIEW COMPARISON:  07/13/2017 radiographs and CT Abdomen and Pelvis FINDINGS: Portable KUB at 0054 hours. The administered oral contrast is within dilated small bowel loops in the left abdomen and pelvis. No contrast identified in the distal small bowel or colon. Small volume of residual contrast in the stomach. Excreted IV contrast in the urinary bladder. Stable enteric tube, side hole at the level of the gastric body. Sequelae of ventral abdominal hernia repair with mesh. No acute osseous abnormality identified. IMPRESSION: 1. Persistent small bowel obstruction. Enteric contrast on this 8 hour post film is within the dilated small bowel loops. 2. Stable enteric tube. Electronically Signed   By: Genevie Ann M.D.   On: 07/14/2017 01:56   Dg Abd Portable 1v-small Bowel Protocol-position Verification  Result Date: 07/13/2017 CLINICAL DATA:  Nasogastric tube placement. EXAM: PORTABLE ABDOMEN - 1 VIEW COMPARISON:  10/23/2016 and abdomen pelvis CT dated 07/13/2017. FINDINGS: Nasogastric tube tip in the region of the gastric pylorus. Single mildly dilated loop of small bowel in the mid to lower abdomen. Gas and stool in normal caliber colon. Excreted contrast in the urinary bladder. Mild scoliosis and mild lumbar spine degenerative changes. Hernia repair mesh at the level of the upper pelvis. IMPRESSION: 1. Nasogastric tube tip in the region of the gastric pylorus. 2. Single mildly dilated loop of small bowel visible in the mid to lower abdomen. Electronically Signed   By: Claudie Revering M.D.   On: 07/13/2017 13:52      Scheduled Meds: . enoxaparin (LOVENOX) injection  40 mg Subcutaneous Q24H  . insulin aspart  0-9 Units Subcutaneous Q4H  . levothyroxine  25 mcg Intravenous Daily   Continuous Infusions: . sodium chloride 100 mL/hr at 07/14/17 1113     LOS: 1 day    Time spent: Total of 25 minutes  spent with pt, greater than 50% of which was spent in discussion of  treatment, counseling and coordination of care    Chipper Oman, MD Pager: Text Page via [email or web address]   If 7PM-7AM, please contact night-coverage [email or web address] 07/14/2017, 6:45 PM   Note - This record has been created using [employer]. Chart creation errors have been sought, but may not always have been located. Such creation errors do not reflect on the standard of medical care.

## 2017-07-15 ENCOUNTER — Inpatient Hospital Stay (HOSPITAL_COMMUNITY): Payer: Medicare Other

## 2017-07-15 LAB — GLUCOSE, CAPILLARY
GLUCOSE-CAPILLARY: 101 mg/dL — AB (ref 65–99)
GLUCOSE-CAPILLARY: 114 mg/dL — AB (ref 65–99)
GLUCOSE-CAPILLARY: 123 mg/dL — AB (ref 65–99)
GLUCOSE-CAPILLARY: 143 mg/dL — AB (ref 65–99)
Glucose-Capillary: 110 mg/dL — ABNORMAL HIGH (ref 65–99)
Glucose-Capillary: 124 mg/dL — ABNORMAL HIGH (ref 65–99)
Glucose-Capillary: 98 mg/dL (ref 65–99)

## 2017-07-15 NOTE — Progress Notes (Signed)
PROGRESS NOTE [facility]   Aarvi Stotts   GNF:[id number] DOB: 1932/02/22  DOA: 07/12/2017 PCP: Javier Glazier, MD   Brief Narrative:  Kendra Ortega is a 84 year old female with significant history of recurrent SBO's and hypothyroidism presented to the emergency department complaining of abdominal pain, nausea and vomiting.  Upon ED evaluation abdomen was found to be distended, CT abdomen and pelvis showed SBO.  Patient was admitted with working diagnosis of small bowel obstruction, surgery was consulted and recommended conservative treatment with NG tube and IV fluids.  Subjective: She is seen and examined, feeling better reports some flatus, no nausea or vomiting.  Assessment & Plan: SBO - Improving clinically  Continue conservative treatment. Per surgical team. Still NPO  Contrast now in colon  Clamping NGT today, ambulate. Continue supportive treatment.    Hypothyroidism Continue IV Synthroid, transition to oral when tolerating PO   Diabetes mellitus type 2 Not on home medication SSI and monitor CBGs  DVT prophylaxis: Lovenox Code Status: Full code Family Communication: None at bedside Disposition Plan: Home when SBO resolved unable to tolerate diet   Consultants:   General surgery  Procedures:   None  Antimicrobials:  None   Objective: Vitals:   07/14/17 2230 07/15/17 0416 07/15/17 0815 07/15/17 1400  BP: (!) 158/81 (!) 149/87 (!) 160/78 (!) 147/87  Pulse: 72 72 76 85  Resp: 18 18 18 18   Temp: 98.3 F (36.8 C) 98.2 F (36.8 C) 98 F (36.7 C) 98.2 F (36.8 C)  TempSrc: Oral Oral Oral Oral  SpO2: 94% 98% 94% 97%  Weight:      Height:        Intake/Output Summary (Last 24 hours) at 07/15/2017 1605 Last data filed at 07/15/2017 1400 Gross per 24 hour  Intake 1300 ml  Output 1300 ml  Net 0 ml   Filed Weights   07/12/17 2341  Weight: 73.5 kg (162 lb)    Examination:  General: NAD Cardiovascular: RRR, S1/S2 +, no rubs, no  gallops Respiratory: CTA bilaterally, no wheezing, no rhonchi Abdominal: Soft, distended, non-tenderness improved bowel sounds   Data Reviewed: I have personally reviewed following labs and imaging studies  CBC: Recent Labs  Lab 07/13/17 0006 07/14/17 0834  WBC 16.5* 10.3  NEUTROABS  --  7.1  HGB 12.7 12.8  HCT 37.5 40.2  MCV 98.7 103.9*  PLT 284 846   Basic Metabolic Panel: Recent Labs  Lab 07/13/17 0006 07/14/17 0834  NA 135 142  K 3.9 4.1  CL 99* 103  CO2 23 29  GLUCOSE 188* 126*  BUN 18 16  CREATININE 0.95 0.96  CALCIUM 9.9 9.3  MG  --  2.5*   GFR: Estimated Creatinine Clearance: 44.6 mL/min (by C-G formula based on SCr of 0.96 mg/dL). Liver Function Tests: Recent Labs  Lab 07/13/17 0006  AST 24  ALT 18  ALKPHOS 86  BILITOT 0.4  PROT 7.7  ALBUMIN 4.6   Recent Labs  Lab 07/13/17 0006  LIPASE 28   No results for input(s): AMMONIA in the last 168 hours. Coagulation Profile: No results for input(s): INR, PROTIME in the last 168 hours. Cardiac Enzymes: No results for input(s): CKTOTAL, CKMB, CKMBINDEX, TROPONINI in the last 168 hours. BNP (last 3 results) No results for input(s): PROBNP in the last 8760 hours. HbA1C: No results for input(s): HGBA1C in the last 72 hours. CBG: Recent Labs  Lab 07/14/17 1959 07/15/17 0008 07/15/17 0413 07/15/17 0734 07/15/17 1139  GLUCAP 131* 114* 143* 110*  123*   Lipid Profile: No results for input(s): CHOL, HDL, LDLCALC, TRIG, CHOLHDL, LDLDIRECT in the last 72 hours. Thyroid Function Tests: No results for input(s): TSH, T4TOTAL, FREET4, T3FREE, THYROIDAB in the last 72 hours. Anemia Panel: No results for input(s): VITAMINB12, FOLATE, FERRITIN, TIBC, IRON, RETICCTPCT in the last 72 hours. Sepsis Labs: No results for input(s): PROCALCITON, LATICACIDVEN in the last 168 hours.  Recent Results (from the past 240 hour(s))  Culture, Urine     Status: None   Collection Time: 07/13/17  6:02 AM  Result Value Ref  Range Status   Specimen Description   Final    URINE, RANDOM Performed at [facility] [address] [place], [place] [address]    Special Requests   Final    NONE Performed at [facility], [place] [address], [place], [place] [address]    Culture   Final    NO GROWTH Performed at [facility], [place] [address], [place], [place] [address]    Report Status 07/14/2017 FINAL  Final      Radiology Studies: Dg Abd 2 Views  Result Date: 07/15/2017 CLINICAL DATA:  Small bowel obstruction EXAM: ABDOMEN - 2 VIEW COMPARISON:  July 14, 2017 abdominal radiograph. July 13, 2017 CT abdomen and pelvis. FINDINGS: Nasogastric tube tip and side port are in the stomach. There are scattered loops of dilated small bowel with occasional air-fluid levels. There is contrast in the colon. No free air. Lung bases are clear. There are multiple vascular calcifications in the pelvis. There is mesh in the lower abdomen as well postoperative etiology. IMPRESSION: Incomplete resolution of bowel obstruction. There are persistent loops of dilated small bowel with scattered air-fluid levels. No free air. Nasogastric tube tip and side port in stomach. Electronically Signed   By: Lowella Grip III M.D.   On: 07/15/2017 09:36   Dg Abd Portable 1v  Result Date: 07/14/2017 CLINICAL DATA:  Followup small bowel obstruction. EXAM: PORTABLE ABDOMEN - 1 VIEW COMPARISON:  07/14/2017 FINDINGS: Enteric tube tip is in the stomach with side port below GE junction. There are persistent dilated loops of small bowel within the left abdomen. The degree of small-bowel dilatation is stable to slightly improved in the interval. Antegrade progression of enteric contrast material from left upper quadrant dilated small bowel loops noted. No abnormal large bowel dilatation. There is a moderate stool burden throughout the colon. IMPRESSION: 1. Partial small bowel obstruction unchanged to slightly improved  in the interval. Electronically Signed   By: Kerby Moors M.D.   On: 07/14/2017 10:25   Dg Abd Portable 1v-small Bowel Obstruction Protocol-initial, 8 Hr Delay  Result Date: 07/14/2017 CLINICAL DATA:  84 year old female with small bowel obstruction. 8 hour delayed film following oral contrast administered by NG tube. EXAM: PORTABLE ABDOMEN - 1 VIEW COMPARISON:  07/13/2017 radiographs and CT Abdomen and Pelvis FINDINGS: Portable KUB at 0054 hours. The administered oral contrast is within dilated small bowel loops in the left abdomen and pelvis. No contrast identified in the distal small bowel or colon. Small volume of residual contrast in the stomach. Excreted IV contrast in the urinary bladder. Stable enteric tube, side hole at the level of the gastric body. Sequelae of ventral abdominal hernia repair with mesh. No acute osseous abnormality identified. IMPRESSION: 1. Persistent small bowel obstruction. Enteric contrast on this 8 hour post film is within the dilated small bowel loops. 2. Stable enteric tube. Electronically Signed   By: Genevie Ann  M.D.   On: 07/14/2017 01:56    Scheduled Meds: . enoxaparin (LOVENOX) injection  40 mg Subcutaneous Q24H  . insulin aspart  0-9 Units Subcutaneous Q4H  . levothyroxine  25 mcg Intravenous Daily   Continuous Infusions: . sodium chloride 75 mL/hr at 07/15/17 1122     LOS: 2 days    Time spent: Total of 15 minutes spent with pt, greater than 50% of which was spent in discussion of  treatment, counseling and coordination of care   Chipper Oman, MD Pager: Text Page via [email or web address]   If 7PM-7AM, please contact night-coverage [email or web address] 07/15/2017, 4:05 PM   Note - This record has been created using [employer]. Chart creation errors have been sought, but may not always have been located. Such creation errors do not reflect on the standard of medical care.

## 2017-07-15 NOTE — Progress Notes (Signed)
[facility] Progress Note     Subjective: CC:  Patient reports feeling a little bit better.  Reports some lower abdominal cramping overnight which has since resolved.  Feels like her stomach is "rumbling more ".  Reports one episode of flatus but denies bowel movement.  Having some belching but denies nausea or vomiting, even when her nasogastric tube is clamped.  States she is walking frequently.  Objective: Vital signs in last 24 hours: Temp:  [98 F (36.7 C)-98.3 F (36.8 C)] 98 F (36.7 C) (03/26 0815) Pulse Rate:  [72-76] 76 (03/26 0815) Resp:  [18] 18 (03/26 0815) BP: (149-160)/(77-87) 160/78 (03/26 0815) SpO2:  [94 %-98 %] 94 % (03/26 0815) Last BM Date: 07/10/17  Intake/Output from previous day: 03/25 0701 - 03/26 0700 In: 2100 [I.V.:2100] Out: 1400 [Urine:1050; Emesis/NG output:350] Intake/Output this shift: Total I/O In: -  Out: 250 [Urine:150; Emesis/NG output:100]  PE: Gen:  Alert, NAD, pleasant Card:  Regular rate and rhythm, no lower extremity edema Pulm:  Normal effort, clear to auscultation bilaterally Abd: Soft, non-tender, mild abdominal distention, bowel sounds present, previous laparotomy scar noted Skin: warm and dry, no rashes  Psych: A&Ox3   Lab Results:  Recent Labs    07/13/17 0006 07/14/17 0834  WBC 16.5* 10.3  HGB 12.7 12.8  HCT 37.5 40.2  PLT 284 280   BMET Recent Labs    07/13/17 0006 07/14/17 0834  NA 135 142  K 3.9 4.1  CL 99* 103  CO2 23 29  GLUCOSE 188* 126*  BUN 18 16  CREATININE 0.95 0.96  CALCIUM 9.9 9.3   PT/INR No results for input(s): LABPROT, INR in the last 72 hours. CMP     Component Value Date/Time   NA 142 07/14/2017 0834   K 4.1 07/14/2017 0834   CL 103 07/14/2017 0834   CO2 29 07/14/2017 0834   GLUCOSE 126 (H) 07/14/2017 0834   BUN 16 07/14/2017 0834   CREATININE 0.96 07/14/2017 0834   CALCIUM 9.3 07/14/2017 0834   PROT 7.7 07/13/2017 0006   ALBUMIN 4.6 07/13/2017 0006   AST 24  07/13/2017 0006   ALT 18 07/13/2017 0006   ALKPHOS 86 07/13/2017 0006   BILITOT 0.4 07/13/2017 0006   GFRNONAA 53 (L) 07/14/2017 0834   GFRAA >60 07/14/2017 0834   Lipase     Component Value Date/Time   LIPASE 28 07/13/2017 0006       Studies/Results: Dg Abd 2 Views  Result Date: 07/15/2017 CLINICAL DATA:  Small bowel obstruction EXAM: ABDOMEN - 2 VIEW COMPARISON:  July 14, 2017 abdominal radiograph. July 13, 2017 CT abdomen and pelvis. FINDINGS: Nasogastric tube tip and side port are in the stomach. There are scattered loops of dilated small bowel with occasional air-fluid levels. There is contrast in the colon. No free air. Lung bases are clear. There are multiple vascular calcifications in the pelvis. There is mesh in the lower abdomen as well postoperative etiology. IMPRESSION: Incomplete resolution of bowel obstruction. There are persistent loops of dilated small bowel with scattered air-fluid levels. No free air. Nasogastric tube tip and side port in stomach. Electronically Signed   By: Lowella Grip III M.D.   On: 07/15/2017 09:36   Dg Abd Portable 1v  Result Date: 07/14/2017 CLINICAL DATA:  Followup small bowel obstruction. EXAM: PORTABLE ABDOMEN - 1 VIEW COMPARISON:  07/14/2017 FINDINGS: Enteric tube tip is in the stomach with side port below GE junction. There are persistent dilated loops of small  bowel within the left abdomen. The degree of small-bowel dilatation is stable to slightly improved in the interval. Antegrade progression of enteric contrast material from left upper quadrant dilated small bowel loops noted. No abnormal large bowel dilatation. There is a moderate stool burden throughout the colon. IMPRESSION: 1. Partial small bowel obstruction unchanged to slightly improved in the interval. Electronically Signed   By: Kerby Moors M.D.   On: 07/14/2017 10:25   Dg Abd Portable 1v-small Bowel Obstruction Protocol-initial, 8 Hr Delay  Result Date:  07/14/2017 CLINICAL DATA:  84 year old female with small bowel obstruction. 8 hour delayed film following oral contrast administered by NG tube. EXAM: PORTABLE ABDOMEN - 1 VIEW COMPARISON:  07/13/2017 radiographs and CT Abdomen and Pelvis FINDINGS: Portable KUB at 0054 hours. The administered oral contrast is within dilated small bowel loops in the left abdomen and pelvis. No contrast identified in the distal small bowel or colon. Small volume of residual contrast in the stomach. Excreted IV contrast in the urinary bladder. Stable enteric tube, side hole at the level of the gastric body. Sequelae of ventral abdominal hernia repair with mesh. No acute osseous abnormality identified. IMPRESSION: 1. Persistent small bowel obstruction. Enteric contrast on this 8 hour post film is within the dilated small bowel loops. 2. Stable enteric tube. Electronically Signed   By: Genevie Ann M.D.   On: 07/14/2017 01:56   Dg Abd Portable 1v-small Bowel Protocol-position Verification  Result Date: 07/13/2017 CLINICAL DATA:  Nasogastric tube placement. EXAM: PORTABLE ABDOMEN - 1 VIEW COMPARISON:  10/23/2016 and abdomen pelvis CT dated 07/13/2017. FINDINGS: Nasogastric tube tip in the region of the gastric pylorus. Single mildly dilated loop of small bowel in the mid to lower abdomen. Gas and stool in normal caliber colon. Excreted contrast in the urinary bladder. Mild scoliosis and mild lumbar spine degenerative changes. Hernia repair mesh at the level of the upper pelvis. IMPRESSION: 1. Nasogastric tube tip in the region of the gastric pylorus. 2. Single mildly dilated loop of small bowel visible in the mid to lower abdomen. Electronically Signed   By: Claudie Revering M.D.   On: 07/13/2017 13:52    Anti-infectives: Anti-infectives (From admission, onward)   None     Assessment/Plan pSBO, h/o multiple prior surgeries -Clinically the patient is showing some improvement with a decrease in NG output and small amount of flatus but  remains distended and nasogastric output is brown. -SBO protocol in place. Repeat films today show contrast has progressed to the colon but there are residual dilated loops of small bowel -Clamp NG tube, allow ice chips and sips of water.  Will check residuals and reexamine patient this afternoon.   FEN - NPO/IVFs; clamp NG VTE - Lovenox, SCDs ID - none  Dispo -clamp NG tube and reassess in afternoon to determine if it can be removed.  Resume low intermittent wall suction if patient develops significant nausea or vomiting.   LOS: 2 days    Jill Alexanders , [facility] 07/15/2017, 10:54 AM Pager: [phone] Consults: [phone] Mon-Fri 7:00 am-4:30 pm Sat-Sun 7:00 am-11:30 am

## 2017-07-15 NOTE — Progress Notes (Signed)
Residual from NG tube checked and was zero. Melina Modena notified. Donne Hazel, RN

## 2017-07-16 DIAGNOSIS — K56600 Partial intestinal obstruction, unspecified as to cause: Principal | ICD-10-CM

## 2017-07-16 DIAGNOSIS — Z0189 Encounter for other specified special examinations: Secondary | ICD-10-CM

## 2017-07-16 DIAGNOSIS — D72829 Elevated white blood cell count, unspecified: Secondary | ICD-10-CM

## 2017-07-16 DIAGNOSIS — D539 Nutritional anemia, unspecified: Secondary | ICD-10-CM

## 2017-07-16 DIAGNOSIS — E876 Hypokalemia: Secondary | ICD-10-CM

## 2017-07-16 DIAGNOSIS — Z853 Personal history of malignant neoplasm of breast: Secondary | ICD-10-CM

## 2017-07-16 LAB — MAGNESIUM: MAGNESIUM: 2 mg/dL (ref 1.7–2.4)

## 2017-07-16 LAB — GLUCOSE, CAPILLARY
GLUCOSE-CAPILLARY: 137 mg/dL — AB (ref 65–99)
GLUCOSE-CAPILLARY: 109 mg/dL — AB (ref 65–99)
GLUCOSE-CAPILLARY: 126 mg/dL — AB (ref 65–99)
Glucose-Capillary: 114 mg/dL — ABNORMAL HIGH (ref 65–99)
Glucose-Capillary: 109 mg/dL — ABNORMAL HIGH (ref 65–99)
Glucose-Capillary: 146 mg/dL — ABNORMAL HIGH (ref 65–99)

## 2017-07-16 LAB — BASIC METABOLIC PANEL
Anion gap: 11 (ref 5–15)
BUN: 17 mg/dL (ref 6–20)
CHLORIDE: 105 mmol/L (ref 101–111)
CO2: 22 mmol/L (ref 22–32)
CREATININE: 0.7 mg/dL (ref 0.44–1.00)
Calcium: 8.5 mg/dL — ABNORMAL LOW (ref 8.9–10.3)
GFR calc Af Amer: >60 mL/min (ref >60)
GFR calc non Af Amer: >60 mL/min (ref >60)
Glucose, Bld: 129 mg/dL — ABNORMAL HIGH (ref 65–99)
Potassium: 3.3 mmol/L — ABNORMAL LOW (ref 3.5–5.1)
SODIUM: 138 mmol/L (ref 135–145)

## 2017-07-16 LAB — CBC WITH DIFFERENTIAL/PLATELET
BASOS ABS: 0 10*3/uL (ref 0.0–0.1)
Basophils Relative: 0 %
Eosinophils Absolute: 0.1 10*3/uL (ref 0.0–0.7)
Eosinophils Relative: 1 %
HEMATOCRIT: 36.6 % (ref 36.0–46.0)
Hemoglobin: 11.7 g/dL — ABNORMAL LOW (ref 12.0–15.0)
LYMPHS PCT: 11 %
Lymphs Abs: 1.3 10*3/uL (ref 0.7–4.0)
MCH: 33.4 pg (ref 26.0–34.0)
MCHC: 32 g/dL (ref 30.0–36.0)
MCV: 104.6 fL — AB (ref 78.0–100.0)
Monocytes Absolute: 1.2 10*3/uL — ABNORMAL HIGH (ref 0.1–1.0)
Monocytes Relative: 11 %
NEUTROS ABS: 9.2 10*3/uL — AB (ref 1.7–7.7)
Neutrophils Relative %: 77 %
Platelets: 261 10*3/uL (ref 150–400)
RBC: 3.5 MIL/uL — AB (ref 3.87–5.11)
RDW: 12.5 % (ref 11.5–15.5)
WBC: 11.9 10*3/uL — AB (ref 4.0–10.5)

## 2017-07-16 LAB — PHOSPHORUS: Phosphorus: 2.3 mg/dL — ABNORMAL LOW (ref 2.5–4.6)

## 2017-07-16 MED ORDER — POTASSIUM CHLORIDE 10 MEQ/100ML IV SOLN
10.0000 meq | INTRAVENOUS | Status: AC
  Administered 2017-07-16 (×4): 10 meq via INTRAVENOUS
  Filled 2017-07-16 (×4): qty 100

## 2017-07-16 MED ORDER — BISACODYL 10 MG RE SUPP: 10.0000 mg | Freq: Every day | RECTAL | Status: DC | PRN

## 2017-07-16 NOTE — Progress Notes (Signed)
PROGRESS NOTE    Kendra Ortega  IRJ:[id number] DOB: 1931-08-28 DOA: 07/12/2017 PCP: Javier Glazier, MD   Brief Narrative: Kendra Ortega is a 84 year old female with significant history of recurrent SBO's and hypothyroidism presented to the emergency department complaining of abdominal pain, nausea and vomiting.  Upon ED evaluation abdomen was found to be distended, CT abdomen and pelvis showed SBO.  Patient was admitted with working diagnosis of small bowel obstruction, surgery was consulted and recommended conservative treatment with NG tube and IV fluids.  Assessment & Plan:   Principal Problem:   SBO (small bowel obstruction) (HCC) Active Problems:   Partial small bowel obstruction (HCC)   Type 2 diabetes mellitus without complication (HCC)   Hypothyroidism   History of breast cancer   Hypokalemia   Hypophosphatemia   Leukocytosis   Macrocytic anemia  Partial SBO - Improving clinically  -Continue conservative treatment and Management Per Surgical team.  -Diet Advanced to Clear Liquids -C/w IVF Rehydration with NS at 75 mL/hr -C/w NGT (clamped) for now and per General Surgery if tolerating clears will be removed  -General Surgery are trying Bisacodyl Suppository and if not improved will consider Enema -Abdominal Flat Plate 07/15/17 showed Incomplete resolution of bowel obstruction. There are persistent loops of dilated small bowel with scattered air-fluid levels. No free air. Nasogastric tube tip and side port in stomach. -Repeat DG Abd Flat Plate in AM   Hypothyroidism -Continue IV Synthroid 25 mcg daily -Rransition to oral when fully tolerating PO   Diabetes mellitus Type 2 -Not on home medication -C/w Sensitive Novolog SSI q4h -CBG's ranging from 98-137  Hx of Right Breast Cancer -Currently not an Active Issue  Hypokalemia -Patient's K+ was 3.3 this AM -Replete with IV KCl 40 mEQ -Continue to Monitor and Replete as Necessary -Repeat CMP in AM    Macrocytic  Anemia -Suspect Dilutional Drop -Hb/Hct went from 12.8/40.2 -> 11.7/36.6 -Continue to Monitor for S/Sx of Bleeding -Repeat CBC in AM  Leukocytosis -Mild at 11.9 and likely reactive -Continue to Monitor for S/Sx of Infection -Urine Cx was Negative  -Repeat CBC in AM -If worsening will pan-culture  Hypophosphatemia  -Mild at 2.3 -Continue to Monitor and Repeat Phos Level  -If still low will replete in AM   DVT prophylaxis: Enoxaparin 40 mg sq q24h Code Status: FULL CODE Family Communication: No Family present at bedside Disposition Plan: Anticipate D/C in next 48 hours if improved clinically   Consultants:   General Surgery  Procedures: None   Antimicrobials:  Anti-infectives (From admission, onward)   None     Subjective: Seen and examined and was doing ok. Wanted to walk but stated she didn't want to over do it. Has some flatus but no BM. No CP or SOB.   Objective: Vitals:   07/15/17 2015 07/15/17 2317 07/16/17 0430 07/16/17 0610  BP: (!) 175/86 132/84 (!) 150/91 (!) 154/72  Pulse: 78  (!) 102   Resp: 20  20   Temp: 98.1 F (36.7 C)  98.1 F (36.7 C)   TempSrc: Oral  Oral   SpO2: 99%  97%   Weight:      Height:        Intake/Output Summary (Last 24 hours) at 07/16/2017 1443 Last data filed at 07/16/2017 1055 Gross per 24 hour  Intake 1908.75 ml  Output 500 ml  Net 1408.75 ml   Filed Weights   07/12/17 2341  Weight: 73.5 kg (162 lb)   Examination: Physical Exam:  Constitutional: WN/WD  Caucasian female in NAD and appears calm  Eyes: Lids and conjunctivae normal, sclerae anicteric  ENMT: External Ears, Nose appear normal. Grossly normal hearing. Has NGT in place  Neck: Appears normal, supple, no cervical masses, normal ROM, no appreciable thyromegaly; no JVD Respiratory: Diminished to auscultation bilaterally, no wheezing, rales, rhonchi or crackles. Normal respiratory effort and patient is not tachypenic. No accessory muscle use.  Cardiovascular:  RRR, no murmurs / rubs / gallops. S1 and S2 auscultated. Trace extremity edema. Abdomen: Soft, mildly tender to palpate, non-distended. No masses palpated. No appreciable hepatosplenomegaly. Bowel sounds diminished and soft.  GU: Deferred. Musculoskeletal: No clubbing / cyanosis of digits/nails. No joint deformity upper and lower extremities. Good ROM, no contractures.  Skin: No rashes, lesions, ulcers on a limited skin eval. No induration; Warm and dry.  Neurologic: CN 2-12 grossly intact with no focal deficits. Sensation intact in all 4 Extremities; Romberg sign and cerebellar reflexes not assessed.  Psychiatric: Normal judgment and insight. Alert and oriented x 3. Normal mood and appropriate affect.   Data Reviewed: I have personally reviewed following labs and imaging studies  CBC: Recent Labs  Lab 07/13/17 0006 07/14/17 0834 07/16/17 0455  WBC 16.5* 10.3 11.9*  NEUTROABS  --  7.1 9.2*  HGB 12.7 12.8 11.7*  HCT 37.5 40.2 36.6  MCV 98.7 103.9* 104.6*  PLT 284 280 696   Basic Metabolic Panel: Recent Labs  Lab 07/13/17 0006 07/14/17 0834 07/16/17 0455  NA 135 142 138  K 3.9 4.1 3.3*  CL 99* 103 105  CO2 23 29 22   GLUCOSE 188* 126* 129*  BUN 18 16 17   CREATININE 0.95 0.96 0.70  CALCIUM 9.9 9.3 8.5*  MG  --  2.5* 2.0  PHOS  --   --  2.3*   GFR: Estimated Creatinine Clearance: 53.5 mL/min (by C-G formula based on SCr of 0.7 mg/dL). Liver Function Tests: Recent Labs  Lab 07/13/17 0006  AST 24  ALT 18  ALKPHOS 86  BILITOT 0.4  PROT 7.7  ALBUMIN 4.6   Recent Labs  Lab 07/13/17 0006  LIPASE 28   No results for input(s): AMMONIA in the last 168 hours. Coagulation Profile: No results for input(s): INR, PROTIME in the last 168 hours. Cardiac Enzymes: No results for input(s): CKTOTAL, CKMB, CKMBINDEX, TROPONINI in the last 168 hours. BNP (last 3 results) No results for input(s): PROBNP in the last 8760 hours. HbA1C: No results for input(s): HGBA1C in the last  72 hours. CBG: Recent Labs  Lab 07/15/17 2019 07/15/17 2353 07/16/17 0401 07/16/17 0741 07/16/17 1143  GLUCAP 124* 98 114* 137* 109*   Lipid Profile: No results for input(s): CHOL, HDL, LDLCALC, TRIG, CHOLHDL, LDLDIRECT in the last 72 hours. Thyroid Function Tests: No results for input(s): TSH, T4TOTAL, FREET4, T3FREE, THYROIDAB in the last 72 hours. Anemia Panel: No results for input(s): VITAMINB12, FOLATE, FERRITIN, TIBC, IRON, RETICCTPCT in the last 72 hours. Sepsis Labs: No results for input(s): PROCALCITON, LATICACIDVEN in the last 168 hours.  Recent Results (from the past 240 hour(s))  Culture, Urine     Status: None   Collection Time: 07/13/17  6:02 AM  Result Value Ref Range Status   Specimen Description   Final    URINE, RANDOM Performed at [facility] [address], [place], [place] [address]    Special Requests   Final    NONE Performed at [facility], [place] [address] [place], [place] [address]    Culture  Final    NO GROWTH Performed at [facility], [place] [address] [place], [place] [address]    Report Status 07/14/2017 FINAL  Final    Radiology Studies: Dg Abd 2 Views  Result Date: 07/15/2017 CLINICAL DATA:  Small bowel obstruction EXAM: ABDOMEN - 2 VIEW COMPARISON:  July 14, 2017 abdominal radiograph. July 13, 2017 CT abdomen and pelvis. FINDINGS: Nasogastric tube tip and side port are in the stomach. There are scattered loops of dilated small bowel with occasional air-fluid levels. There is contrast in the colon. No free air. Lung bases are clear. There are multiple vascular calcifications in the pelvis. There is mesh in the lower abdomen as well postoperative etiology. IMPRESSION: Incomplete resolution of bowel obstruction. There are persistent loops of dilated small bowel with scattered air-fluid levels. No free air. Nasogastric tube tip and side port in stomach. Electronically Signed   By: Lowella Grip  III M.D.   On: 07/15/2017 09:36   Scheduled Meds: . enoxaparin (LOVENOX) injection  40 mg Subcutaneous Q24H  . insulin aspart  0-9 Units Subcutaneous Q4H  . levothyroxine  25 mcg Intravenous Daily   Continuous Infusions: . sodium chloride 75 mL/hr at 07/16/17 1008  . potassium chloride 10 mEq (07/16/17 1352)    LOS: 3 days   Kerney Elbe, DO [facility] Pager [phone]  If 7PM-7AM, please contact night-coverage [email or web address] Password [facility] 07/16/2017, 2:43 PM

## 2017-07-16 NOTE — Progress Notes (Signed)
[facility] Progress Note     Subjective: CC:  Still endorses intermittent, lower abdominal pain, typically occurs during/after walking. Denies worsening bloating. Denies nausea/vomiting. Minimal flatus. No BM. Mobilizing regularly. Drank 3 small cups of juice and a little water yesterday. Patient reports intermittent constipation at home requiring fleet enema.   Objective: Vital signs in last 24 hours: Temp:  [98.1 F (36.7 C)-98.2 F (36.8 C)] 98.1 F (36.7 C) (03/27 0430) Pulse Rate:  [78-102] 102 (03/27 0430) Resp:  [18-20] 20 (03/27 0430) BP: (132-175)/(72-91) 154/72 (03/27 0610) SpO2:  [97 %-99 %] 97 % (03/27 0430) Last BM Date: 07/10/17  Intake/Output from previous day: 03/26 0701 - 03/27 0700 In: 1200 [I.V.:1200] Out: 550 [Urine:450; Emesis/NG output:100] Intake/Output this shift: No intake/output data recorded.  PE: Gen:  Alert, NAD, pleasant Card:  Regular rate and rhythm, no lower extremity edema  Pulm:  Normal effort, clear to auscultation bilaterally Abd: Soft, mild TTP lower abdomen, mild to moderate distention with upper abdominal tympany, tinkering bowel sounds in RUQ. Skin: warm and dry, no rashes  Psych: A&Ox3   Lab Results:  Recent Labs    07/14/17 0834 07/16/17 0455  WBC 10.3 11.9*  HGB 12.8 11.7*  HCT 40.2 36.6  PLT 280 261   BMET Recent Labs    07/14/17 0834 07/16/17 0455  NA 142 138  K 4.1 3.3*  CL 103 105  CO2 29 22  GLUCOSE 126* 129*  BUN 16 17  CREATININE 0.96 0.70  CALCIUM 9.3 8.5*   PT/INR No results for input(s): LABPROT, INR in the last 72 hours. CMP     Component Value Date/Time   NA 138 07/16/2017 0455   K 3.3 (L) 07/16/2017 0455   CL 105 07/16/2017 0455   CO2 22 07/16/2017 0455   GLUCOSE 129 (H) 07/16/2017 0455   BUN 17 07/16/2017 0455   CREATININE 0.70 07/16/2017 0455   CALCIUM 8.5 (L) 07/16/2017 0455   PROT 7.7 07/13/2017 0006   ALBUMIN 4.6 07/13/2017 0006   AST 24 07/13/2017 0006   ALT 18  07/13/2017 0006   ALKPHOS 86 07/13/2017 0006   BILITOT 0.4 07/13/2017 0006   GFRNONAA >60 07/16/2017 0455   GFRAA >60 07/16/2017 0455   Lipase     Component Value Date/Time   LIPASE 28 07/13/2017 0006       Studies/Results: Dg Abd 2 Views  Result Date: 07/15/2017 CLINICAL DATA:  Small bowel obstruction EXAM: ABDOMEN - 2 VIEW COMPARISON:  July 14, 2017 abdominal radiograph. July 13, 2017 CT abdomen and pelvis. FINDINGS: Nasogastric tube tip and side port are in the stomach. There are scattered loops of dilated small bowel with occasional air-fluid levels. There is contrast in the colon. No free air. Lung bases are clear. There are multiple vascular calcifications in the pelvis. There is mesh in the lower abdomen as well postoperative etiology. IMPRESSION: Incomplete resolution of bowel obstruction. There are persistent loops of dilated small bowel with scattered air-fluid levels. No free air. Nasogastric tube tip and side port in stomach. Electronically Signed   By: Lowella Grip III M.D.   On: 07/15/2017 09:36    Anti-infectives: Anti-infectives (From admission, onward)   None     Assessment/Plan pSBO, h/o multiple prior surgeries -Clinically the patient is showing some improvement of symptoms - keeping down liquids and small amount of flatus. But still no BM, still distended, still intermittent belching. -SBO protocol in place. Repeat films 3/26show contrast has progressed to the colon but  there are residual dilated loops of small bowel - trial of clear liquid tray around NG tube.  - dulcolax suppository, may need enema   FEN -NPO/IVFs; clamp NG VTE -Lovenox, SCDs ID -none  Dispo - trial of clear liquid tray, suppository. May need enema. Possible removal of NG later today.    LOS: 3 days    [facility] 07/16/2017, 10:04 AM Pager: [phone] Consults: [phone] Mon-Fri 7:00 am-4:30 pm Sat-Sun 7:00 am-11:30 am

## 2017-07-16 NOTE — Care Management Important Message (Signed)
Important Message  Patient Details  Name: Reverie Vaquera MRN: [id number] Date of Birth: [birth year]   Medicare Important Message Given:  Yes    Kerin Salen 07/16/2017, 10:49 AMImportant Message  Patient Details  Name: Audree Schrecengost MRN: [id number] Date of Birth: 1931-09-19   Medicare Important Message Given:  Yes    Kerin Salen 07/16/2017, 10:49 AM

## 2017-07-17 ENCOUNTER — Inpatient Hospital Stay (HOSPITAL_COMMUNITY): Payer: Medicare Other

## 2017-07-17 DIAGNOSIS — R1031 Right lower quadrant pain: Secondary | ICD-10-CM

## 2017-07-17 LAB — CBC WITH DIFFERENTIAL/PLATELET
Basophils Absolute: 0 10*3/uL (ref 0.0–0.1)
Basophils Relative: 0 %
EOS PCT: 1 %
Eosinophils Absolute: 0.1 10*3/uL (ref 0.0–0.7)
HCT: 29.7 % — ABNORMAL LOW (ref 36.0–46.0)
Hemoglobin: 9.8 g/dL — ABNORMAL LOW (ref 12.0–15.0)
LYMPHS ABS: 1.7 10*3/uL (ref 0.7–4.0)
Lymphocytes Relative: 22 %
MCH: 33 pg (ref 26.0–34.0)
MCHC: 33 g/dL (ref 30.0–36.0)
MCV: 100 fL (ref 78.0–100.0)
MONOS PCT: 13 %
Monocytes Absolute: 1 10*3/uL (ref 0.1–1.0)
Neutro Abs: 4.9 10*3/uL (ref 1.7–7.7)
Neutrophils Relative %: 64 %
Platelets: 221 10*3/uL (ref 150–400)
RBC: 2.97 MIL/uL — AB (ref 3.87–5.11)
RDW: 12 % (ref 11.5–15.5)
WBC: 7.7 10*3/uL (ref 4.0–10.5)

## 2017-07-17 LAB — COMPREHENSIVE METABOLIC PANEL
ALT: 13 U/L — ABNORMAL LOW (ref 14–54)
ANION GAP: 6 (ref 5–15)
AST: 18 U/L (ref 15–41)
Albumin: 2.8 g/dL — ABNORMAL LOW (ref 3.5–5.0)
Alkaline Phosphatase: 45 U/L (ref 38–126)
BUN: 8 mg/dL (ref 6–20)
CHLORIDE: 109 mmol/L (ref 101–111)
CO2: 23 mmol/L (ref 22–32)
Calcium: 8.1 mg/dL — ABNORMAL LOW (ref 8.9–10.3)
Creatinine, Ser: 0.67 mg/dL (ref 0.44–1.00)
GFR calc non Af Amer: >60 mL/min (ref >60)
Glucose, Bld: 98 mg/dL (ref 65–99)
POTASSIUM: 3.6 mmol/L (ref 3.5–5.1)
SODIUM: 138 mmol/L (ref 135–145)
Total Bilirubin: 0.7 mg/dL (ref 0.3–1.2)
Total Protein: 5.2 g/dL — ABNORMAL LOW (ref 6.5–8.1)

## 2017-07-17 LAB — MAGNESIUM: MAGNESIUM: 1.7 mg/dL (ref 1.7–2.4)

## 2017-07-17 LAB — GLUCOSE, CAPILLARY
GLUCOSE-CAPILLARY: 86 mg/dL (ref 65–99)
GLUCOSE-CAPILLARY: 105 mg/dL — AB (ref 65–99)
GLUCOSE-CAPILLARY: 132 mg/dL — AB (ref 65–99)
GLUCOSE-CAPILLARY: 135 mg/dL — AB (ref 65–99)
GLUCOSE-CAPILLARY: 112 mg/dL — AB (ref 65–99)
Glucose-Capillary: 98 mg/dL (ref 65–99)

## 2017-07-17 LAB — PHOSPHORUS: PHOSPHORUS: 2.1 mg/dL — AB (ref 2.5–4.6)

## 2017-07-17 MED ORDER — LEVOTHYROXINE SODIUM 50 MCG PO TABS
50.0000 ug | ORAL_TABLET | Freq: Every day | ORAL | Status: DC
  Administered 2017-07-18: 50 ug via ORAL
  Filled 2017-07-17: qty 1

## 2017-07-17 MED ORDER — K PHOS MONO-SOD PHOS DI & MONO 155-852-130 MG PO TABS
500.0000 mg | ORAL_TABLET | Freq: Two times a day (BID) | ORAL | Status: AC
  Administered 2017-07-17 (×2): 500 mg via ORAL
  Filled 2017-07-17 (×2): qty 2

## 2017-07-17 MED ORDER — INSULIN ASPART 100 UNIT/ML ~~LOC~~ SOLN
0.0000 [IU] | Freq: Three times a day (TID) | SUBCUTANEOUS | Status: DC
  Administered 2017-07-18: 1 [IU] via SUBCUTANEOUS

## 2017-07-17 MED ORDER — INSULIN ASPART 100 UNIT/ML ~~LOC~~ SOLN: 0.0000 [IU] | Freq: Every day | SUBCUTANEOUS | Status: DC

## 2017-07-17 NOTE — Discharge Instructions (Signed)
Low-Fiber Diet °Fiber is found in fruits, vegetables, and whole grains. A low-fiber diet restricts fibrous foods that are not digested in the small intestine. A diet containing about 10-15 grams of fiber per day is considered low fiber. Low-fiber diets may be used to: °· Promote healing and rest the bowel during intestinal flare-ups. °· Prevent blockage of a partially obstructed or narrowed gastrointestinal tract. °· Reduce fecal weight and volume. °· Slow the movement of feces. ° °You may be on a low-fiber diet as a transitional diet following surgery, after an injury (trauma), or because of a short (acute) or lifelong (chronic) illness. Your health care provider will determine the length of time you need to stay on this diet. °What do I need to know about a low-fiber diet? °Always check the fiber content on the packaging's Nutrition Facts label, especially on foods from the grains list. Ask your dietitian if you have questions about specific foods that are related to your condition, especially if the food is not listed below. In general, a low-fiber food will have less than 2 g of fiber. °What foods can I eat? °Grains °All breads and crackers made with white flour. Sweet rolls, doughnuts, waffles, pancakes, French toast, bagels. Pretzels, Melba toast, zwieback. Well-cooked cereals, such as cornmeal, farina, or cream cereals. Dry cereals that do not contain whole grains, fruit, or nuts, such as refined corn, wheat, rice, and oat cereals. Potatoes prepared any way without skins, plain pastas and noodles, refined white rice. Use white flour for baking and making sauces. Use allowed list of grains for casseroles, dumplings, and puddings. °Vegetables °Strained tomato and vegetable juices. Fresh lettuce, cucumber, spinach. Well-cooked (no skin or pulp) or canned vegetables, such as asparagus, bean sprouts, beets, carrots, green beans, mushrooms, potatoes, pumpkin, spinach, yellow squash, tomato sauce/puree, turnips,  yams, and zucchini. Keep servings limited to ½ cup. °Fruits °All fruit juices except prune juice. Cooked or canned fruits without skin and seeds, such as applesauce, apricots, cherries, fruit cocktail, grapefruit, grapes, mandarin oranges, melons, peaches, pears, pineapple, and plums. Fresh fruits without skin, such as apricots, avocados, bananas, melons, pineapple, nectarines, and peaches. Keep servings limited to ½ cup or 1 piece. °Meat and Other Protein Sources °Ground or well-cooked tender beef, ham, veal, lamb, pork, or poultry. Eggs, plain cheese. Fish, oysters, shrimp, lobster, and other seafood. Liver, organ meats. Smooth nut butters. °Dairy °All milk products and alternative dairy substitutes, such as soy, rice, almond, and coconut, not containing added whole nuts, seeds, or added fruit. °Beverages °Decaf coffee, fruit, and vegetable juices or smoothies (small amounts, with no pulp or skins, and with fruits from allowed list), sports drinks, herbal tea. °Condiments °Ketchup, mustard, vinegar, cream sauce, cheese sauce, cocoa powder. Spices in moderation, such as allspice, basil, bay leaves, celery powder or leaves, cinnamon, cumin powder, curry powder, ginger, mace, marjoram, onion or garlic powder, oregano, paprika, parsley flakes, ground pepper, rosemary, sage, savory, tarragon, thyme, and turmeric. °Sweets and Desserts °Plain cakes and cookies, pie made with allowed fruit, pudding, custard, cream pie. Gelatin, fruit, ice, sherbet, frozen ice pops. Ice cream, ice milk without nuts. Plain hard candy, honey, jelly, molasses, syrup, sugar, chocolate syrup, gumdrops, marshmallows. Limit overall sugar intake. °Fats and Oil °Margarine, butter, cream, mayonnaise, salad oils, plain salad dressings made from allowed foods. Choose healthy fats such as olive oil, canola oil, and omega-3 fatty acids (such as found in salmon or tuna) when possible. °Other °Bouillon, broth, or cream soups made from allowed foods. Any    strained soup. Casseroles or mixed dishes made with allowed foods. °The items listed above may not be a complete list of recommended foods or beverages. Contact your dietitian for more options. °What foods are not recommended? °Grains °All whole wheat and whole grain breads and crackers. Multigrains, rye, bran seeds, nuts, or coconut. Cereals containing whole grains, multigrains, bran, coconut, nuts, raisins. Cooked or dry oatmeal, steel-cut oats. Coarse wheat cereals, granola. Cereals advertised as high fiber. Potato skins. Whole grain pasta, wild or brown rice. Popcorn. Coconut flour. Bran, buckwheat, corn bread, multigrains, rye, wheat germ. °Vegetables °Fresh, cooked or canned vegetables, such as artichokes, asparagus, beet greens, broccoli, Brussels sprouts, cabbage, celery, cauliflower, corn, eggplant, kale, legumes or beans, okra, peas, and tomatoes. Avoid large servings of any vegetables, especially raw vegetables. °Fruits °Fresh fruits, such as apples with or without skin, berries, cherries, figs, grapes, grapefruit, guavas, kiwis, mangoes, oranges, papayas, pears, persimmons, pineapple, and pomegranate. Prune juice and juices with pulp, stewed or dried prunes. Dried fruits, dates, raisins. Fruit seeds or skins. Avoid large servings of all fresh fruits. °Meats and Other Protein Sources °Tough, fibrous meats with gristle. Chunky nut butter. Cheese made with seeds, nuts, or other foods not recommended. Nuts, seeds, legumes (beans, including baked beans), dried peas, beans, lentils. °Dairy °Yogurt or cheese that contains nuts, seeds, or added fruit. °Beverages °Fruit juices with high pulp, prune juice. Caffeinated coffee and teas. °Condiments °Coconut, maple syrup, pickles, olives. °Sweets and Desserts °Desserts, cookies, or candies that contain nuts or coconut, chunky peanut butter, dried fruits. Jams, preserves with seeds, marmalade. Large amounts of sugar and sweets. Any other dessert made with fruits from  the not recommended list. °Other °Soups made from vegetables that are not recommended or that contain other foods not recommended. °The items listed above may not be a complete list of foods and beverages to avoid. Contact your dietitian for more information. °This information is not intended to replace advice given to you by your health care provider. Make sure you discuss any questions you have with your health care provider. °Document Released: 09/28/2001 Document Revised: 09/14/2015 Document Reviewed: 03/01/2013 °Elsevier Interactive Patient Education © 2017 [employer] ° °

## 2017-07-17 NOTE — Progress Notes (Signed)
[facility] Progress Note     Subjective: CC:  Denies abdominal pain. Tolerating diet. Having flatus and BM. Mobilizing regularly.   Objective: Vital signs in last 24 hours: Temp:  [98.2 F (36.8 C)-98.7 F (37.1 C)] 98.2 F (36.8 C) (03/28 0508) Pulse Rate:  [66-85] 66 (03/28 0508) Resp:  [16-18] 17 (03/28 0508) BP: (90-141)/(47-87) 117/47 (03/28 0508) SpO2:  [96 %-100 %] 96 % (03/28 0508) Last BM Date: 07/16/17(small amount )  Intake/Output from previous day: 03/27 0701 - 03/28 0700 In: 3683.8 [P.O.:820; I.V.:2563.8; IV Piggyback:300] Out: 1000 [Urine:1000] Intake/Output this shift: No intake/output data recorded.  PE: Gen:  Alert, NAD, pleasant Card:  Regular rate and rhythm, pedal pulses 2+ BL Pulm:  Normal effort, clear to auscultation bilaterally Abd: Soft, non-tender, mild distention, +BS Skin: warm and dry, no rashes  Psych: A&Ox3   Lab Results:  Recent Labs    07/16/17 0455 07/17/17 0436  WBC 11.9* 7.7  HGB 11.7* 9.8*  HCT 36.6 29.7*  PLT 261 221   BMET Recent Labs    07/16/17 0455 07/17/17 0436  NA 138 138  K 3.3* 3.6  CL 105 109  CO2 22 23  GLUCOSE 129* 98  BUN 17 8  CREATININE 0.70 0.67  CALCIUM 8.5* 8.1*   PT/INR No results for input(s): LABPROT, INR in the last 72 hours. CMP     Component Value Date/Time   NA 138 07/17/2017 0436   K 3.6 07/17/2017 0436   CL 109 07/17/2017 0436   CO2 23 07/17/2017 0436   GLUCOSE 98 07/17/2017 0436   BUN 8 07/17/2017 0436   CREATININE 0.67 07/17/2017 0436   CALCIUM 8.1 (L) 07/17/2017 0436   PROT 5.2 (L) 07/17/2017 0436   ALBUMIN 2.8 (L) 07/17/2017 0436   AST 18 07/17/2017 0436   ALT 13 (L) 07/17/2017 0436   ALKPHOS 45 07/17/2017 0436   BILITOT 0.7 07/17/2017 0436   GFRNONAA >60 07/17/2017 0436   GFRAA >60 07/17/2017 0436   Lipase     Component Value Date/Time   LIPASE 28 07/13/2017 0006       Studies/Results: Dg Abd 1 View  Result Date: 07/17/2017 CLINICAL DATA:   Abdominal pain distension EXAM: ABDOMEN - 1 VIEW COMPARISON:  07/15/2017 FINDINGS: Gastric tube removed. Progressive small bowel dilatation since the prior study. Gas in nondilated right colon. Small amount of air in the rectum. Ventral hernia repair with mesh IMPRESSION: Progressive small bowel dilatation following NG tube removal. Findings consistent with small bowel obstruction. Electronically Signed   By: Franchot Gallo M.D.   On: 07/17/2017 07:18   Dg Abd 2 Views  Result Date: 07/15/2017 CLINICAL DATA:  Small bowel obstruction EXAM: ABDOMEN - 2 VIEW COMPARISON:  July 14, 2017 abdominal radiograph. July 13, 2017 CT abdomen and pelvis. FINDINGS: Nasogastric tube tip and side port are in the stomach. There are scattered loops of dilated small bowel with occasional air-fluid levels. There is contrast in the colon. No free air. Lung bases are clear. There are multiple vascular calcifications in the pelvis. There is mesh in the lower abdomen as well postoperative etiology. IMPRESSION: Incomplete resolution of bowel obstruction. There are persistent loops of dilated small bowel with scattered air-fluid levels. No free air. Nasogastric tube tip and side port in stomach. Electronically Signed   By: Lowella Grip III M.D.   On: 07/15/2017 09:36    Anti-infectives: Anti-infectives (From admission, onward)   None     Assessment/Plan pSBO, h/o multiple prior  surgeries -Clinically resolving and bowel function returning. -SBO protocol in place.Repeat films 3/26show contrast has progressed to the colon -tolerating diet, advance to SOFT today   FEN -SOFT VTE -Lovenox, SCDs ID -none  Dispo - stable for discharge from surgical perspective if tolerates soft diet. Instructions for low fiber diet provided.     LOS: 4 days    Jill Alexanders , [facility] 07/17/2017, 8:33 AM Pager: [phone] Consults: [phone] Mon-Fri 7:00 am-4:30 pm Sat-Sun 7:00 am-11:30  am

## 2017-07-17 NOTE — Progress Notes (Signed)
PROGRESS NOTE    Kendra Ortega  AJG:[id number] DOB: [birth year] DOA: 07/12/2017 PCP: Javier Glazier, MD   Brief Narrative: Kendra Ortega is a 84 year old female with significant history of recurrent SBO's and hypothyroidism presented to the emergency department complaining of abdominal pain, nausea and vomiting.  Upon ED evaluation abdomen was found to be distended, CT abdomen and pelvis showed SBO.  Patient was admitted for  small bowel obstruction; Surgery was consulted and recommended conservative treatment with NG tube and IV fluids. She is improved and NGT has been removed today. Diet has been advanced to Soft Diet. Anticipate D/C'd Home if improved in AM.   Assessment & Plan:   Principal Problem:   SBO (small bowel obstruction) (HCC) Active Problems:   Partial small bowel obstruction (HCC)   Type 2 diabetes mellitus without complication (HCC)   Hypothyroidism   History of breast cancer   Hypokalemia   Hypophosphatemia   Leukocytosis   Macrocytic anemia  Partial SBO - Improving  -Continue conservative treatment and Management Per Surgical team.  -Diet Advanced from Clear Liquids to Soft Diet per General Surgery  -D/C'd IVF Rehydration with NS at 75 mL/hr -NGT removed -General Surgery are trying Bisacodyl Suppository and if not improved will consider Enema -Abdominal Flat Plate 07/15/17 showed Incomplete resolution of bowel obstruction. There are persistent loops of dilated small bowel with scattered air-fluid levels. No free air. Nasogastric tube tip and side port in stomach. -Repeat DG Abd Flat Plate this AM showed Gastric tube removed. Progressive small bowel dilatation since the prior study. Gas in nondilated right colon. Small amount of air in the rectum. -Per General Surgery it may take a week or so for her intestines to normalize so they recommended small meals and low residue -Anticipate D/C in AM if tolerates diet with no Issues.   Hypothyroidism -Changed IV  Synthroid 25 mcg daily to po -Resume po in AM  Diabetes mellitus Type 2 -Not on home medication -Change Sensitive Novolog SSI q4h to Sensitive Novolog SSI AC/HS -CBG's ranging from 98-133  Hx of Right Breast Cancer -Currently not an Active Issue  Hypokalemia -Patient's K+ was 3.6 this AM -Continue to Monitor and Replete as Necessary -Repeat CMP in AM    Macrocytic Anemia -Suspect Dilutional Drop -Hb/Hct went from 12.8/40.2 -> 11.7/36.6 -> 9.8/29.7 -Continue to Monitor for S/Sx of Bleeding -Repeat CBC in AM  Leukocytosis, improved -Mild at 11.9 and likely reactive; Improved and now was 7.7 -Continue to Monitor for S/Sx of Infection -Urine Cx was Negative  -Repeat CBC in AM  Hypophosphatemia  -Mild at 2.1 -Replete with K Phos Neutral 500 mg po BID x2 -Continue to Monitor and Replete as Necessary -Repeat CMP in AM   DVT prophylaxis: Enoxaparin 40 mg sq q24h Code Status: FULL CODE Family Communication: No Family present at bedside Disposition Plan: Anticipate D/C in next 48 hours if improved clinically   Consultants:   General Surgery  Procedures: None   Antimicrobials:  Anti-infectives (From admission, onward)   None     Subjective: Seen and examined and still complained of some abdominal pain. No CP or SOB. Tolerated her Clear Liquid Diet so Diet was advanced to soft   Objective: Vitals:   07/16/17 1400 07/16/17 2102 07/17/17 0508 07/17/17 1331  BP: (!) 90/51 (!) 141/87 (!) 117/47 139/68  Pulse: 74 85 66 73  Resp: 18 16 17 17   Temp: 98.7 F (37.1 C) 98.4 F (36.9 C) 98.2 F (36.8 C) 98.4 F (36.9 C)  TempSrc: Oral Oral Oral Oral  SpO2: 99% 100% 96% 100%  Weight:      Height:        Intake/Output Summary (Last 24 hours) at 07/17/2017 1748 Last data filed at 07/17/2017 1542 Gross per 24 hour  Intake 1903.75 ml  Output 1000 ml  Net 903.75 ml   Filed Weights   07/12/17 2341  Weight: 73.5 kg (162 lb)   Examination: Physical  Exam:  Constitutional: Pleasant Caucasian female in NAD appears calm and comfortable  Eyes: Sclerae anicteric. Lids normal ENMT: External Ears and Nose appear normal Neck: Supple with no JVD Respiratory: CTAB; Unlabored breathing; No wheezing/rales/rhonchi Cardiovascular: RRR; No appreciable m/r/g. Abdomen: Soft, Slightly tender to palpate, ND. Bowel sounds present  GU: Deferred Musculoskeletal: No contractures; No cyanosis Skin: Warm and Dry; No rashes or lesions on a limited skin evaluation Neurologic: CN 2-12 grossly intact. No appreciable focal deficits Psychiatric: Normal and pleasant mood and affect. Intact judgement and insight  Data Reviewed: I have personally reviewed following labs and imaging studies  CBC: Recent Labs  Lab 07/13/17 0006 07/14/17 0834 07/16/17 0455 07/17/17 0436  WBC 16.5* 10.3 11.9* 7.7  NEUTROABS  --  7.1 9.2* 4.9  HGB 12.7 12.8 11.7* 9.8*  HCT 37.5 40.2 36.6 29.7*  MCV 98.7 103.9* 104.6* 100.0  PLT 284 280 261 185   Basic Metabolic Panel: Recent Labs  Lab 07/13/17 0006 07/14/17 0834 07/16/17 0455 07/17/17 0436  NA 135 142 138 138  K 3.9 4.1 3.3* 3.6  CL 99* 103 105 109  CO2 23 29 22 23   GLUCOSE 188* 126* 129* 98  BUN 18 16 17 8   CREATININE 0.95 0.96 0.70 0.67  CALCIUM 9.9 9.3 8.5* 8.1*  MG  --  2.5* 2.0 1.7  PHOS  --   --  2.3* 2.1*   GFR: Estimated Creatinine Clearance: 53.5 mL/min (by C-G formula based on SCr of 0.67 mg/dL). Liver Function Tests: Recent Labs  Lab 07/13/17 0006 07/17/17 0436  AST 24 18  ALT 18 13*  ALKPHOS 86 45  BILITOT 0.4 0.7  PROT 7.7 5.2*  ALBUMIN 4.6 2.8*   Recent Labs  Lab 07/13/17 0006  LIPASE 28   No results for input(s): AMMONIA in the last 168 hours. Coagulation Profile: No results for input(s): INR, PROTIME in the last 168 hours. Cardiac Enzymes: No results for input(s): CKTOTAL, CKMB, CKMBINDEX, TROPONINI in the last 168 hours. BNP (last 3 results) No results for input(s): PROBNP in  the last 8760 hours. HbA1C: No results for input(s): HGBA1C in the last 72 hours. CBG: Recent Labs  Lab 07/16/17 2314 07/17/17 0344 07/17/17 0715 07/17/17 1137 07/17/17 1652  GLUCAP 146* 86 105* 132* 98   Lipid Profile: No results for input(s): CHOL, HDL, LDLCALC, TRIG, CHOLHDL, LDLDIRECT in the last 72 hours. Thyroid Function Tests: No results for input(s): TSH, T4TOTAL, FREET4, T3FREE, THYROIDAB in the last 72 hours. Anemia Panel: No results for input(s): VITAMINB12, FOLATE, FERRITIN, TIBC, IRON, RETICCTPCT in the last 72 hours. Sepsis Labs: No results for input(s): PROCALCITON, LATICACIDVEN in the last 168 hours.  Recent Results (from the past 240 hour(s))  Culture, Urine     Status: None   Collection Time: 07/13/17  6:02 AM  Result Value Ref Range Status   Specimen Description   Final    URINE, RANDOM Performed at [facility] [address] [place], [place] [address]    Special Requests   Final    NONE Performed  at [facility], [place] [address], [place], [place] [address]    Culture   Final    NO GROWTH Performed at [facility], [place] [address], [place], [place] [address]    Report Status 07/14/2017 FINAL  Final    Radiology Studies: Dg Abd 1 View  Result Date: 07/17/2017 CLINICAL DATA:  Abdominal pain distension EXAM: ABDOMEN - 1 VIEW COMPARISON:  07/15/2017 FINDINGS: Gastric tube removed. Progressive small bowel dilatation since the prior study. Gas in nondilated right colon. Small amount of air in the rectum. Ventral hernia repair with mesh IMPRESSION: Progressive small bowel dilatation following NG tube removal. Findings consistent with small bowel obstruction. Electronically Signed   By: Franchot Gallo M.D.   On: 07/17/2017 07:18   Scheduled Meds: . enoxaparin (LOVENOX) injection  40 mg Subcutaneous Q24H  . insulin aspart  0-9 Units Subcutaneous Q4H  . levothyroxine  25 mcg Intravenous Daily  . phosphorus  500 mg  Oral BID   Continuous Infusions:   LOS: 4 days   Kerney Elbe, DO [facility] Pager [phone]  If 7PM-7AM, please contact night-coverage [email or web address] Password [facility] 07/17/2017, 5:48 PM

## 2017-07-18 DIAGNOSIS — R109 Unspecified abdominal pain: Secondary | ICD-10-CM

## 2017-07-18 LAB — CBC WITH DIFFERENTIAL/PLATELET
BASOS ABS: 0 10*3/uL (ref 0.0–0.1)
Basophils Relative: 0 %
EOS ABS: 0.1 10*3/uL (ref 0.0–0.7)
EOS PCT: 2 %
HCT: 29.1 % — ABNORMAL LOW (ref 36.0–46.0)
Hemoglobin: 9.7 g/dL — ABNORMAL LOW (ref 12.0–15.0)
LYMPHS PCT: 48 %
Lymphs Abs: 2.8 10*3/uL (ref 0.7–4.0)
MCH: 33.2 pg (ref 26.0–34.0)
MCHC: 33.3 g/dL (ref 30.0–36.0)
MCV: 99.7 fL (ref 78.0–100.0)
MONO ABS: 0.7 10*3/uL (ref 0.1–1.0)
Monocytes Relative: 12 %
Neutro Abs: 2.2 10*3/uL (ref 1.7–7.7)
Neutrophils Relative %: 38 %
PLATELETS: 220 10*3/uL (ref 150–400)
RBC: 2.92 MIL/uL — ABNORMAL LOW (ref 3.87–5.11)
RDW: 12 % (ref 11.5–15.5)
WBC: 5.8 10*3/uL (ref 4.0–10.5)

## 2017-07-18 LAB — COMPREHENSIVE METABOLIC PANEL
ALT: 17 U/L (ref 14–54)
AST: 22 U/L (ref 15–41)
Albumin: 2.9 g/dL — ABNORMAL LOW (ref 3.5–5.0)
Alkaline Phosphatase: 51 U/L (ref 38–126)
Anion gap: 8 (ref 5–15)
BUN: 6 mg/dL (ref 6–20)
CHLORIDE: 109 mmol/L (ref 101–111)
CO2: 24 mmol/L (ref 22–32)
Calcium: 8 mg/dL — ABNORMAL LOW (ref 8.9–10.3)
Creatinine, Ser: 0.7 mg/dL (ref 0.44–1.00)
Glucose, Bld: 103 mg/dL — ABNORMAL HIGH (ref 65–99)
POTASSIUM: 3 mmol/L — AB (ref 3.5–5.1)
Sodium: 141 mmol/L (ref 135–145)
Total Bilirubin: 0.3 mg/dL (ref 0.3–1.2)
Total Protein: 5.2 g/dL — ABNORMAL LOW (ref 6.5–8.1)

## 2017-07-18 LAB — GLUCOSE, CAPILLARY
GLUCOSE-CAPILLARY: 96 mg/dL (ref 65–99)
GLUCOSE-CAPILLARY: 101 mg/dL — AB (ref 65–99)
Glucose-Capillary: 110 mg/dL — ABNORMAL HIGH (ref 65–99)
Glucose-Capillary: 138 mg/dL — ABNORMAL HIGH (ref 65–99)

## 2017-07-18 LAB — MAGNESIUM: MAGNESIUM: 1.8 mg/dL (ref 1.7–2.4)

## 2017-07-18 LAB — PHOSPHORUS: PHOSPHORUS: 3.5 mg/dL (ref 2.5–4.6)

## 2017-07-18 MED ORDER — POTASSIUM CHLORIDE 10 MEQ/100ML IV SOLN
10.0000 meq | INTRAVENOUS | Status: AC
  Administered 2017-07-18 (×3): 10 meq via INTRAVENOUS
  Filled 2017-07-18 (×3): qty 100

## 2017-07-18 MED ORDER — POTASSIUM CHLORIDE CRYS ER 20 MEQ PO TBCR
40.0000 meq | EXTENDED_RELEASE_TABLET | Freq: Two times a day (BID) | ORAL | Status: DC
Start: 2017-07-18 — End: 2017-07-18
  Administered 2017-07-18: 40 meq via ORAL
  Filled 2017-07-18: qty 2

## 2017-07-18 MED ORDER — MAGNESIUM SULFATE 2 GM/50ML IV SOLN
2.0000 g | Freq: Once | INTRAVENOUS | Status: AC
  Administered 2017-07-18: 2 g via INTRAVENOUS
  Filled 2017-07-18: qty 50

## 2017-07-18 NOTE — Progress Notes (Signed)
[facility]/Trauma Progress Note      Assessment/Plan pSBO, h/o multiple prior surgeries -Clinically resolving and having bowel function . -SBO protocol in place.Repeat films3/26show contrast has progressed to the colon -tolerating diet  FEN -SOFT VTE -Lovenox, SCDs ID -none  Dispo -stable for discharge from surgical perspective.  We will sign off at this time.  Please page [place] with any further needs for this patient.   LOS: 5 days    Subjective: CC: SBO  Patient states she is feeling much better today.  She has had multiple bowel movements.  She is tolerating a soft diet.  She has no abdominal discomfort or distention.  She is anxious to leave.  Objective: Vital signs in last 24 hours: Temp:  [97.8 F (36.6 C)-98.4 F (36.9 C)] 97.8 F (36.6 C) (03/29 0412) Pulse Rate:  [66-73] 71 (03/29 0412) Resp:  [17-18] 18 (03/29 0412) BP: (116-153)/(63-85) 116/85 (03/29 0412) SpO2:  [97 %-100 %] 97 % (03/29 0412) Last BM Date: 07/18/17  Intake/Output from previous day: 03/28 0701 - 03/29 0700 In: 480 [P.O.:480] Out: 800 [Urine:800] Intake/Output this shift: No intake/output data recorded.  PE: Gen:  Alert, NAD, pleasant, cooperative Card:  RRR, no M/G/R heard Pulm: Rate and effort normal Abd: Soft, NT/ND, +BS, no HSM Skin: no rashes noted, warm and dry   Anti-infectives: Anti-infectives (From admission, onward)   None      Lab Results:  Recent Labs    07/17/17 0436 07/18/17 0424  WBC 7.7 5.8  HGB 9.8* 9.7*  HCT 29.7* 29.1*  PLT 221 220   BMET Recent Labs    07/17/17 0436 07/18/17 0424  NA 138 141  K 3.6 3.0*  CL 109 109  CO2 23 24  GLUCOSE 98 103*  BUN 8 6  CREATININE 0.67 0.70  CALCIUM 8.1* 8.0*   PT/INR No results for input(s): LABPROT, INR in the last 72 hours. CMP     Component Value Date/Time   NA 141 07/18/2017 0424   K 3.0 (L) 07/18/2017 0424   CL 109 07/18/2017 0424   CO2 24 07/18/2017 0424   GLUCOSE 103 (H)  07/18/2017 0424   BUN 6 07/18/2017 0424   CREATININE 0.70 07/18/2017 0424   CALCIUM 8.0 (L) 07/18/2017 0424   PROT 5.2 (L) 07/18/2017 0424   ALBUMIN 2.9 (L) 07/18/2017 0424   AST 22 07/18/2017 0424   ALT 17 07/18/2017 0424   ALKPHOS 51 07/18/2017 0424   BILITOT 0.3 07/18/2017 0424   GFRNONAA >60 07/18/2017 0424   GFRAA >60 07/18/2017 0424   Lipase     Component Value Date/Time   LIPASE 28 07/13/2017 0006    Studies/Results: Dg Abd 1 View  Result Date: 07/17/2017 CLINICAL DATA:  Abdominal pain distension EXAM: ABDOMEN - 1 VIEW COMPARISON:  07/15/2017 FINDINGS: Gastric tube removed. Progressive small bowel dilatation since the prior study. Gas in nondilated right colon. Small amount of air in the rectum. Ventral hernia repair with mesh IMPRESSION: Progressive small bowel dilatation following NG tube removal. Findings consistent with small bowel obstruction. Electronically Signed   By: Franchot Gallo M.D.   On: 07/17/2017 07:18      Kalman Drape , [facility] 07/18/2017, 9:46 AM  Pager: [phone] Mon-Wed, Friday 7:00am-4:30pm Thurs 7am-11:30am  Consults: [phone]

## 2017-07-18 NOTE — Progress Notes (Signed)
Pt was discharged home today. Instructions were reviewed with patient, and questions were answered. Pt was taken to main entrance via wheelchair by NT.  

## 2017-07-18 NOTE — Discharge Summary (Signed)
Physician Discharge Summary  Kendra Ortega QQP:[id number] DOB: 14-Nov-1931 DOA: 07/12/2017  PCP: Javier Glazier, MD  Admit date: 07/12/2017 Discharge date: 07/18/2017  Admitted From: Home Disposition: Home  Recommendations for Outpatient Follow-up:  1. Follow up with PCP in 1-2 weeks 2. Follow up with General Surgery as an outpatient 3. Follow up with Gastroenterology as an outpateint 4. Please obtain CMP/CBC, Mag, Phos in one week 5. Please follow up on the following pending results:  Home Health: No Equipment/Devices: None  Discharge Condition: Stable CODE STATUS: FULL CODE Diet recommendation: Heart Healthy Soft Diet  Brief/Interim Summary:  Kendra Ortega a 84 year old female with significant history of recurrent SBO's and hypothyroidism presented to the emergency department complaining of abdominal pain, nausea and vomiting. Upon ED evaluation abdomen was found to be distended, CT abdomen and pelvis showed SBO. Patient was admitted for  small bowel obstruction; Surgery was consulted and recommended conservative treatment with NG tube and IV fluids. She is improved and NGT has been removed yesterday. Diet has been advanced to Soft Diet and she tolerated it well. General Surgery evaluated and had nothing else to offer as she was doing well and partial small bowel resolved. She was deemed medically stable to D/C Home and follow up with PCP, General Surgery and Gastroenterology as an outpatient.   Discharge Diagnoses:  Principal Problem:   SBO (small bowel obstruction) (HCC) Active Problems:   Partial small bowel obstruction (HCC)   Type 2 diabetes mellitus without complication (HCC)   Hypothyroidism   History of breast cancer   Hypokalemia   Hypophosphatemia   Leukocytosis   Macrocytic anemia  Partial SBO- Improved -Continued conservative treatment and Management Per Surgical team.  -Diet Advanced from Clear Liquids to Soft Diet per General Surgery  -D/C'd IVF  Rehydration with NS at 75 mL/hr -NGT removed -General Surgery are tried Bisacodyl Suppository and if not improved will consider Enema -Abdominal Flat Plate 07/15/17 showed Incomplete resolution of bowel obstruction. There are persistent loops of dilated small bowel with scattered air-fluid levels. No free air. Nasogastric tube tip and side port in stomach. -Repeat DG Abd Flat Plate this AM showed Gastric tube removed. Progressive small bowel dilatation since the prior study. Gas in nondilated right colon. Small amount of air in the rectum. -Per General Surgery it may take a week or so for her intestines to normalize so they recommended small meals and low residue -Will D/C Home as she is stable; Patient wants to talk to GI so will give number to [employer] as an outpatient.   Hypothyroidism -Changed IV Synthroid 25 mcg daily to po -Resumed po this AM -Repeat TSH and T4 as an outpatient   Diabetes mellitus Type 2, well controlled  -Not on home medication -Changed Sensitive Novolog SSI q4h to Sensitive Novolog SSI AC/HS -CBG's ranging from 96-138  Hx of Right Breast Cancer -Currently not an Active Issue  Macrocytic Anemia -Suspect Dilutional Drop -Hb/Hct went from 12.8/40.2 -> 11.7/36.6 -> 9.8/29.7 -> 9.7/29.1 -Continue to Monitor for S/Sx of Bleeding -Repeat CBC as an outpatient   Leukocytosis, improved -Mild at 11.9 and likely reactive; Improved and now was 5.8 -Continue to Monitor for S/Sx of Infection -Urine Cx was Negative  -Repeat CBC as an outpatient   Hypophosphatemia  -Mild at 2.1 and improved to 3.5 -Replete with K Phos Neutral 500 mg po BID x2 yesterday -Continue to Monitor and Replete as Necessary -Repeat Phos as an outpatient    Hypokalemia -Patient's K+ was 3.0 this  AM -Replete prior to D/C -Continue to Monitor and Repeat CMP as an outpatient   Discharge Instructions  Discharge Instructions    Call MD for:  difficulty breathing, headache or  visual disturbances   Complete by:  As directed    Call MD for:  extreme fatigue   Complete by:  As directed    Call MD for:  hives   Complete by:  As directed    Call MD for:  persistant dizziness or light-headedness   Complete by:  As directed    Call MD for:  persistant nausea and vomiting   Complete by:  As directed    Call MD for:  redness, tenderness, or signs of infection (pain, swelling, redness, odor or green/yellow discharge around incision site)   Complete by:  As directed    Call MD for:  severe uncontrolled pain   Complete by:  As directed    Call MD for:  temperature >100.4   Complete by:  As directed    Diet - low sodium heart healthy   Complete by:  As directed    Soft Low Residue Diet   Discharge instructions   Complete by:  As directed    Follow up with PCP, General Surgery, and Gastroenterology as an outpatient. Take all medications as prescribed. If symptoms change or worsen please return to the ED for evaluation.   Increase activity slowly   Complete by:  As directed      Allergies as of 07/18/2017   No Known Allergies     Medication List    TAKE these medications   CALCIUM 600 600 MG Tabs tablet Generic drug:  calcium carbonate Take 600 mg by mouth every other day.   docusate sodium 100 MG capsule Commonly known as:  COLACE Take 100 mg by mouth 2 (two) times daily.   glimepiride 1 MG tablet Commonly known as:  AMARYL Take 1 mg by mouth daily with breakfast.   levothyroxine 50 MCG tablet Commonly known as:  SYNTHROID, LEVOTHROID Take 50 mcg by mouth daily before breakfast.   magnesium oxide 400 MG tablet Commonly known as:  MAG-OX Take 400 mg by mouth at bedtime.   MULTIVITAMIN WOMEN PO Take 1 tablet by mouth daily.   Vitamin D3 1000 units Caps Take 1,000 Units by mouth every other day.      Follow-up Information    Javier Glazier, MD. Call.   Specialty:  Internal Medicine Why:  Follow up within 1 week  Contact information: [place] [address] [phone]        Irene Shipper, MD. Call.   Specialty:  Gastroenterology Why:  Follow up within 1 week  Contact information: [address] [place] [address] [phone]          No Known Allergies  Consultations:  General Surgery  Procedures/Studies: Dg Abd 1 View  Result Date: 07/17/2017 CLINICAL DATA:  Abdominal pain distension EXAM: ABDOMEN - 1 VIEW COMPARISON:  07/15/2017 FINDINGS: Gastric tube removed. Progressive small bowel dilatation since the prior study. Gas in nondilated right colon. Small amount of air in the rectum. Ventral hernia repair with mesh IMPRESSION: Progressive small bowel dilatation following NG tube removal. Findings consistent with small bowel obstruction. Electronically Signed   By: Franchot Gallo M.D.   On: 07/17/2017 07:18   Ct Abdomen Pelvis W Contrast  Result Date: 07/13/2017 CLINICAL DATA:  Acute abdominal pain. Vomiting. History of bowel obstruction. EXAM: CT ABDOMEN  AND PELVIS WITH CONTRAST TECHNIQUE: Multidetector CT imaging of the abdomen and pelvis was performed using the standard protocol following bolus administration of intravenous contrast. CONTRAST:  134mL ISOVUE-300 IOPAMIDOL (ISOVUE-300) INJECTION 61% COMPARISON:  CT 10/19/2016 FINDINGS: Lower chest: Minimal hypoventilatory atelectasis. No pleural fluid or consolidation. Hepatobiliary: Decreased hepatic density suggests steatosis. No discrete focal lesion. Prominent size liver spanning 21 cm cranial caudal. Gallbladder physiologically distended, no calcified stone. No biliary dilatation. Pancreas: No ductal dilatation or inflammation. Spleen: Normal in size without focal abnormality. Adrenals/Urinary Tract: 15 mm low-density right adrenal nodule is unchanged from prior exam. The left adrenal gland is normal. No hydronephrosis or perinephric edema. Homogeneous renal enhancement with symmetric excretion on delayed phase imaging. Urinary bladder is  partially distended without wall thickening. Stomach/Bowel: Fluid-filled partially distended stomach. Small duodenal diverticulum. Dilated fluid-filled small bowel with fecalization of contents and transition point in the central abdomen, best appreciated on coronal image 36. More distal small bowel is decompressed. Minimal central mesenteric edema without pneumatosis or perforation. Moderate volume of stool throughout the colon. Appendix not visualized. Sigmoid diverticulosis without diverticulitis. Vascular/Lymphatic: Aorto bi-iliac atherosclerosis without aneurysm. No enlarged abdominal or pelvic lymph nodes. Reproductive: Status post hysterectomy. No adnexal masses. Other: Trace mesenteric edema and free fluid in the region of bowel obstruction. No free air or intra-abdominal abscess. Postsurgical change of the anterior abdominal wall. Musculoskeletal: There are no acute or suspicious osseous abnormalities. IMPRESSION: 1. Small bowel obstruction with transition point in the central abdomen, likely adhesions. No perforation. 2. Sigmoid diverticulosis without diverticulitis. 3. Hepatic steatosis. 4.  Aortic Atherosclerosis (ICD10-I70.0). 5. Stable 91mm right adrenal nodule likely adenoma. Electronically Signed   By: Jeb Levering M.D.   On: 07/13/2017 01:28   Dg Abd 2 Views  Result Date: 07/15/2017 CLINICAL DATA:  Small bowel obstruction EXAM: ABDOMEN - 2 VIEW COMPARISON:  July 14, 2017 abdominal radiograph. July 13, 2017 CT abdomen and pelvis. FINDINGS: Nasogastric tube tip and side port are in the stomach. There are scattered loops of dilated small bowel with occasional air-fluid levels. There is contrast in the colon. No free air. Lung bases are clear. There are multiple vascular calcifications in the pelvis. There is mesh in the lower abdomen as well postoperative etiology. IMPRESSION: Incomplete resolution of bowel obstruction. There are persistent loops of dilated small bowel with scattered  air-fluid levels. No free air. Nasogastric tube tip and side port in stomach. Electronically Signed   By: Lowella Grip III M.D.   On: 07/15/2017 09:36   Dg Abd Portable 1v  Result Date: 07/14/2017 CLINICAL DATA:  Followup small bowel obstruction. EXAM: PORTABLE ABDOMEN - 1 VIEW COMPARISON:  07/14/2017 FINDINGS: Enteric tube tip is in the stomach with side port below GE junction. There are persistent dilated loops of small bowel within the left abdomen. The degree of small-bowel dilatation is stable to slightly improved in the interval. Antegrade progression of enteric contrast material from left upper quadrant dilated small bowel loops noted. No abnormal large bowel dilatation. There is a moderate stool burden throughout the colon. IMPRESSION: 1. Partial small bowel obstruction unchanged to slightly improved in the interval. Electronically Signed   By: Kerby Moors M.D.   On: 07/14/2017 10:25   Dg Abd Portable 1v-small Bowel Obstruction Protocol-initial, 8 Hr Delay  Result Date: 07/14/2017 CLINICAL DATA:  84 year old female with small bowel obstruction. 8 hour delayed film following oral contrast administered by NG tube. EXAM: PORTABLE ABDOMEN - 1 VIEW COMPARISON:  07/13/2017 radiographs and CT Abdomen and  Pelvis FINDINGS: Portable KUB at 0054 hours. The administered oral contrast is within dilated small bowel loops in the left abdomen and pelvis. No contrast identified in the distal small bowel or colon. Small volume of residual contrast in the stomach. Excreted IV contrast in the urinary bladder. Stable enteric tube, side hole at the level of the gastric body. Sequelae of ventral abdominal hernia repair with mesh. No acute osseous abnormality identified. IMPRESSION: 1. Persistent small bowel obstruction. Enteric contrast on this 8 hour post film is within the dilated small bowel loops. 2. Stable enteric tube. Electronically Signed   By: Genevie Ann M.D.   On: 07/14/2017 01:56   Dg Abd Portable  1v-small Bowel Protocol-position Verification  Result Date: 07/13/2017 CLINICAL DATA:  Nasogastric tube placement. EXAM: PORTABLE ABDOMEN - 1 VIEW COMPARISON:  10/23/2016 and abdomen pelvis CT dated 07/13/2017. FINDINGS: Nasogastric tube tip in the region of the gastric pylorus. Single mildly dilated loop of small bowel in the mid to lower abdomen. Gas and stool in normal caliber colon. Excreted contrast in the urinary bladder. Mild scoliosis and mild lumbar spine degenerative changes. Hernia repair mesh at the level of the upper pelvis. IMPRESSION: 1. Nasogastric tube tip in the region of the gastric pylorus. 2. Single mildly dilated loop of small bowel visible in the mid to lower abdomen. Electronically Signed   By: Claudie Revering M.D.   On: 07/13/2017 13:52     Subjective: Seen and examined at bedside and improved. No CP or SOB. Abdominal Pain resolved. Doing well and tolerating her diet and wanting to go home.  Discharge Exam: Vitals:   07/17/17 2019 07/18/17 0412  BP: (!) 153/63 116/85  Pulse: 66 71  Resp: 18 18  Temp: 98 F (36.7 C) 97.8 F (36.6 C)  SpO2: 99% 97%   Vitals:   07/17/17 0508 07/17/17 1331 07/17/17 2019 07/18/17 0412  BP: (!) 117/47 139/68 (!) 153/63 116/85  Pulse: 66 73 66 71  Resp: 17 17 18 18   Temp: 98.2 F (36.8 C) 98.4 F (36.9 C) 98 F (36.7 C) 97.8 F (36.6 C)  TempSrc: Oral Oral Oral Oral  SpO2: 96% 100% 99% 97%  Weight:      Height:       General: Pt is alert, awake, not in acute distress Cardiovascular: RRR, S1/S2 +, no rubs, no gallops Respiratory: CTA bilaterally, no wheezing, no rhonchi Abdominal: Soft, Mildly tender, ND, bowel sounds + Extremities: no edema, no cyanosis  The results of significant diagnostics from this hospitalization (including imaging, microbiology, ancillary and laboratory) are listed below for reference.    Microbiology: Recent Results (from the past 240 hour(s))  Culture, Urine     Status: None   Collection Time:  07/13/17  6:02 AM  Result Value Ref Range Status   Specimen Description   Final    URINE, RANDOM Performed at [facility] [address] [place], [place] [address]    Special Requests   Final    NONE Performed at [facility], [place] [address] [place], [place] [address]    Culture   Final    NO GROWTH Performed at [facility], [place] [address], [place], [place] [address]    Report Status 07/14/2017 FINAL  Final    Labs: BNP (last 3 results) No results for input(s): BNP in the last 8760 hours. Basic Metabolic Panel: Recent Labs  Lab 07/13/17 0006 07/14/17 9147 07/16/17 0455 07/17/17 0436 07/18/17 0424  NA  135 142 138 138 141  K 3.9 4.1 3.3* 3.6 3.0*  CL 99* 103 105 109 109  CO2 23 29 22 23 24   GLUCOSE 188* 126* 129* 98 103*  BUN 18 16 17 8 6   CREATININE 0.95 0.96 0.70 0.67 0.70  CALCIUM 9.9 9.3 8.5* 8.1* 8.0*  MG  --  2.5* 2.0 1.7 1.8  PHOS  --   --  2.3* 2.1* 3.5   Liver Function Tests: Recent Labs  Lab 07/13/17 0006 07/17/17 0436 07/18/17 0424  AST 24 18 22   ALT 18 13* 17  ALKPHOS 86 45 51  BILITOT 0.4 0.7 0.3  PROT 7.7 5.2* 5.2*  ALBUMIN 4.6 2.8* 2.9*   Recent Labs  Lab 07/13/17 0006  LIPASE 28   No results for input(s): AMMONIA in the last 168 hours. CBC: Recent Labs  Lab 07/13/17 0006 07/14/17 0834 07/16/17 0455 07/17/17 0436 07/18/17 0424  WBC 16.5* 10.3 11.9* 7.7 5.8  NEUTROABS  --  7.1 9.2* 4.9 2.2  HGB 12.7 12.8 11.7* 9.8* 9.7*  HCT 37.5 40.2 36.6 29.7* 29.1*  MCV 98.7 103.9* 104.6* 100.0 99.7  PLT 284 280 261 221 220   Cardiac Enzymes: No results for input(s): CKTOTAL, CKMB, CKMBINDEX, TROPONINI in the last 168 hours. BNP: Invalid input(s): POCBNP CBG: Recent Labs  Lab 07/17/17 2009 07/17/17 2159 07/18/17 0005 07/18/17 0409 07/18/17 0746  GLUCAP 135* 112* 110* 96 101*   D-Dimer No results for input(s): DDIMER in the last 72 hours. Hgb A1c No results for input(s): HGBA1C in  the last 72 hours. Lipid Profile No results for input(s): CHOL, HDL, LDLCALC, TRIG, CHOLHDL, LDLDIRECT in the last 72 hours. Thyroid function studies No results for input(s): TSH, T4TOTAL, T3FREE, THYROIDAB in the last 72 hours.  Invalid input(s): FREET3 Anemia work up No results for input(s): VITAMINB12, FOLATE, FERRITIN, TIBC, IRON, RETICCTPCT in the last 72 hours. Urinalysis    Component Value Date/Time   COLORURINE YELLOW 07/13/2017 0229   APPEARANCEUR CLOUDY (A) 07/13/2017 0229   LABSPEC <1.005 (L) 07/13/2017 0229   PHURINE 7.5 07/13/2017 0229   GLUCOSEU NEGATIVE 07/13/2017 0229   HGBUR SMALL (A) 07/13/2017 0229   BILIRUBINUR NEGATIVE 07/13/2017 0229   KETONESUR NEGATIVE 07/13/2017 0229   PROTEINUR NEGATIVE 07/13/2017 0229   NITRITE NEGATIVE 07/13/2017 0229   LEUKOCYTESUR MODERATE (A) 07/13/2017 0229   Sepsis Labs Invalid input(s): PROCALCITONIN,  WBC,  LACTICIDVEN Microbiology Recent Results (from the past 240 hour(s))  Culture, Urine     Status: None   Collection Time: 07/13/17  6:02 AM  Result Value Ref Range Status   Specimen Description   Final    URINE, RANDOM Performed at [facility], [place] [address] [place], [place] [address]    Special Requests   Final    NONE Performed at [facility] [place], [place] [address], [place], [place] [address]    Culture   Final    NO GROWTH Performed at [facility], [place] [address], [place], [place] [address]    Report Status 07/14/2017 FINAL  Final   Time coordinating discharge: 25 minutes  SIGNED:  Kerney Elbe, DO [facility] 07/18/2017, 11:14 AM Pager [phone]  If 7PM-7AM, please contact night-coverage [email or web address] Password [email or web address]

## 2017-07-21 ENCOUNTER — Encounter: Payer: Self-pay | Admitting: Internal Medicine

## 2017-07-23 ENCOUNTER — Encounter: Payer: Self-pay | Admitting: Gastroenterology

## 2017-07-31 ENCOUNTER — Ambulatory Visit: Payer: Medicare Other | Admitting: Physician Assistant

## 2017-08-07 ENCOUNTER — Ambulatory Visit (INDEPENDENT_AMBULATORY_CARE_PROVIDER_SITE_OTHER): Payer: Medicare Other | Admitting: Gastroenterology

## 2017-08-07 ENCOUNTER — Encounter: Payer: Self-pay | Admitting: Gastroenterology

## 2017-08-07 VITALS — BP 140/80 | HR 78 | Ht 65.0 in | Wt 161.4 lb

## 2017-08-07 DIAGNOSIS — K56609 Unspecified intestinal obstruction, unspecified as to partial versus complete obstruction: Secondary | ICD-10-CM

## 2017-08-07 NOTE — Progress Notes (Signed)
08/07/2017 Nilda Keathley [id number] [birth year]   HISTORY OF PRESENT ILLNESS:  This is an 84 year old female with history of multiple abdominal surgeries and recurrent small bowel obstructions (in addition to appendectomy and hysterectomy she reports having some type of surgery in 1998 for small bowel obstruction).  She has had 2 small bowel obstructions since June 2018.  They have both resolved with conservative measures.  She is here today for recommended hospital follow-up.  Her most recent CT scan during this last hospitalization showed small bowel obstruction with transition point in the central abdomen likely due to adhesions.  She was discharged from the hospital on March 29 and has been doing well since that time.  She has several questions about diet.  She tells me that classically she has issues with constipation and incomplete emptying of stool.    She had a colonoscopy in July 2013 by Dr. Jeannett Senior in [place], [place] that she reports was normal.  We are going to obtain his records.   Past Medical History:  Diagnosis Date  . Bowel obstruction ([place])   . Cancer [facility])    right breast cancer  . Thyroid disease    Past Surgical History:  Procedure Laterality Date  . ABDOMINAL HYSTERECTOMY    . APPENDECTOMY    . BREAST SURGERY     lumpectomy  . TONSILLECTOMY      reports that she has never smoked. She has never used smokeless tobacco. She reports that she drinks alcohol. She reports that she does not use drugs. family history is not on file. No Known Allergies    Outpatient Encounter Medications as of 08/07/2017  Medication Sig  . calcium carbonate (CALCIUM 600) 600 MG TABS tablet Take 600 mg by mouth every other day.  . Cholecalciferol (VITAMIN D3) 1000 units CAPS Take 1,000 Units by mouth every other day.  Marland Kitchen glimepiride (AMARYL) 1 MG tablet Take 1 mg by mouth daily with breakfast.  . levothyroxine (SYNTHROID, LEVOTHROID) 50 MCG tablet Take 50 mcg by mouth daily before  breakfast.  . magnesium oxide (MAG-OX) 400 MG tablet Take 400 mg by mouth at bedtime.  . Multiple Vitamins-Minerals (MULTIVITAMIN WOMEN PO) Take 1 tablet by mouth daily.  . [DISCONTINUED] docusate sodium (COLACE) 100 MG capsule Take 100 mg by mouth 2 (two) times daily.   No facility-administered encounter medications on file as of 08/07/2017.      REVIEW OF SYSTEMS  : All other systems reviewed and negative except where noted in the History of Present Illness.   PHYSICAL EXAM: BP 140/80   Pulse 78   Ht 5\' 5"  (1.651 m)   Wt 161 lb 6.4 oz (73.2 kg)   BMI 26.86 kg/m  General: Well developed white female in no acute distress Head: Normocephalic and atraumatic Eyes:  Sclerae anicteric, conjunctiva pink. Ears: Normal auditory acuity Lungs: Clear throughout to auscultation; no increased WOB. Heart: Regular rate and rhythm; no M/R/G. Abdomen: Soft, non-distended.  BS present.  Non-tender.  Scars from previous surgeries noted on abdomen. Musculoskeletal: Symmetrical with no gross deformities  Skin: No lesions on visible extremities Extremities: No edema  Neurological: Alert oriented x 4, grossly non-focal Psychological:  Alert and cooperative. Normal mood and affect  ASSESSMENT AND PLAN: *84 year old female with history of multiple abdominal surgeries and recurrent small bowel obstructions.  She has had 2 small bowel obstructions since June 2018.  They have both resolved with conservative measures.  She is here today for recommended hospital follow-up.  We discussed that low fiber diet, mostly avoiding raw fruits and vegetables, skins on fruits and vegetables, salads, etc. will likely be her best bet to avoiding another episode.  She is also going to begin MiraLAX once or twice daily to keep her bowels moving well.  **She had a colonoscopy in July 2013 by Dr. Jeannett Senior in [place], [place] that she reports was normal.  We are going to obtain his records.  **Patient requesting Dr. Henrene Pastor  for her primary MD.  CC:  Javier Glazier, MD

## 2017-08-07 NOTE — Patient Instructions (Addendum)
If you are age 82 or older, your body mass index should be between 23-30. Your Body mass index is 26.86 kg/m. If this is out of the aforementioned range listed, please consider follow up with your Primary Care Provider.  If you are age 33 or younger, your body mass index should be between 19-25. Your Body mass index is 26.86 kg/m. If this is out of the aformentioned range listed, please consider follow up with your Primary Care Provider.   Take Miralax once or twice daily.  We have requested records from Dr. Jeannett Senior in [place].  Thank you for choosing me and [facility].   Alonza Bogus, PA-C

## 2017-08-12 NOTE — Progress Notes (Signed)
Assessment and plan reviewed 

## 2017-08-13 ENCOUNTER — Telehealth: Payer: Self-pay

## 2017-08-13 NOTE — Telephone Encounter (Signed)
The pt complains of watery diarrhea in the morning, she is taking 2 doses of miralax daily.  The pt has been advised to decrease her miralax to once daily or every other day for her diarrhea.  She will call back if she does not have a good response.

## 2017-09-03 ENCOUNTER — Ambulatory Visit: Payer: Medicare Other | Admitting: Internal Medicine

## 2017-09-03 ENCOUNTER — Encounter

## 2017-09-30 ENCOUNTER — Ambulatory Visit: Payer: Medicare Other | Admitting: Internal Medicine

## 2020-01-13 ENCOUNTER — Other Ambulatory Visit: Payer: Self-pay

## 2020-01-13 ENCOUNTER — Encounter (HOSPITAL_BASED_OUTPATIENT_CLINIC_OR_DEPARTMENT_OTHER): Payer: Self-pay | Admitting: Emergency Medicine

## 2020-01-13 ENCOUNTER — Emergency Department (HOSPITAL_BASED_OUTPATIENT_CLINIC_OR_DEPARTMENT_OTHER): Payer: Medicare Other

## 2020-01-13 ENCOUNTER — Emergency Department (HOSPITAL_BASED_OUTPATIENT_CLINIC_OR_DEPARTMENT_OTHER)
Admission: EM | Admit: 2020-01-13 | Discharge: 2020-01-13 | Disposition: A | Discharge status: Home / Self Care | Payer: Medicare Other | Attending: Emergency Medicine | Admitting: Emergency Medicine

## 2020-01-13 DIAGNOSIS — E119 Type 2 diabetes mellitus without complications: Secondary | ICD-10-CM | POA: Diagnosis not present

## 2020-01-13 DIAGNOSIS — S0101XA Laceration without foreign body of scalp, initial encounter: Secondary | ICD-10-CM | POA: Diagnosis not present

## 2020-01-13 DIAGNOSIS — Z8542 Personal history of malignant neoplasm of other parts of uterus: Secondary | ICD-10-CM | POA: Insufficient documentation

## 2020-01-13 DIAGNOSIS — S0990XA Unspecified injury of head, initial encounter: Secondary | ICD-10-CM | POA: Diagnosis present

## 2020-01-13 DIAGNOSIS — E039 Hypothyroidism, unspecified: Secondary | ICD-10-CM | POA: Insufficient documentation

## 2020-01-13 DIAGNOSIS — Z79890 Hormone replacement therapy (postmenopausal): Secondary | ICD-10-CM | POA: Insufficient documentation

## 2020-01-13 DIAGNOSIS — C50911 Malignant neoplasm of unspecified site of right female breast: Secondary | ICD-10-CM | POA: Diagnosis not present

## 2020-01-13 DIAGNOSIS — X58XXXA Exposure to other specified factors, initial encounter: Secondary | ICD-10-CM | POA: Diagnosis not present

## 2020-01-13 DIAGNOSIS — Z7984 Long term (current) use of oral hypoglycemic drugs: Secondary | ICD-10-CM | POA: Diagnosis not present

## 2020-01-13 NOTE — ED Triage Notes (Signed)
Pt reports head injury  against cabinet door , fall after that , landed forward . denies hit head after the fall. Present with scalp laceration. Bleeding controled. Baby aspirin daily . Alert and oriented x 4. denies pain anywhere else.

## 2020-01-14 NOTE — ED Provider Notes (Signed)
[facility] DEPART[facility] Provider Note   CSN: [id number] Arrival date & time: 01/13/20  1005     History Chief Complaint  Patient presents with  . Head Injury    Kendra Ortega is a 86 y.o. female.  The history is provided by the patient. No language interpreter was used.  Head Injury Location:  Generalized Time since incident:  1 hour Mechanism of injury: direct blow   Pain details:    Quality:  Aching   Severity:  Mild   Duration:  1 hour   Timing:  Constant   Progression:  Worsening Chronicity:  New Relieved by:  Nothing Worsened by:  Nothing Ineffective treatments:  None tried Associated symptoms: no neck pain and no vomiting   Risk factors: being elderly        Past Medical History:  Diagnosis Date  . Bowel obstruction ([place])   . Cancer [facility])    right breast cancer  . Thyroid disease     Patient Active Problem List   Diagnosis Date Noted  . Hypokalemia 07/16/2017  . Hypophosphatemia 07/16/2017  . Leukocytosis 07/16/2017  . Macrocytic anemia 07/16/2017  . SBO (small bowel obstruction) ([place]) 07/13/2017  . Small bowel obstruction ([place]) 10/19/2016  . Partial small bowel obstruction ([place]) 10/19/2016  . Type 2 diabetes mellitus without complication ([place]) 40/98/1191  . Hypothyroidism 10/19/2016  . IBS (irritable bowel syndrome) 10/19/2016  . History of breast cancer 10/19/2016  . History of uterine cancer 10/19/2016    Past Surgical History:  Procedure Laterality Date  . ABDOMINAL HYSTERECTOMY    . APPENDECTOMY    . BREAST SURGERY     lumpectomy  . TONSILLECTOMY       OB History   No obstetric history on file.     No family history on file.  Social History   Tobacco Use  . Smoking status: Never Smoker  . Smokeless tobacco: Never Used  Vaping Use  . Vaping Use: Never used  Substance Use Topics  . Alcohol use: Yes    Comment: some days  . Drug use: No    Home Medications Prior to Admission medications   Medication  Sig Start Date End Date Taking? Authorizing Provider  calcium carbonate (CALCIUM 600) 600 MG TABS tablet Take 600 mg by mouth every other day. 01/28/12   [provider]  Cholecalciferol (VITAMIN D3) 1000 units CAPS Take 1,000 Units by mouth every other day. 03/01/13   [provider]  glimepiride (AMARYL) 1 MG tablet Take 1 mg by mouth daily with breakfast.    [provider]  levothyroxine (SYNTHROID, LEVOTHROID) 50 MCG tablet Take 50 mcg by mouth daily before breakfast.    [provider]  magnesium oxide (MAG-OX) 400 MG tablet Take 400 mg by mouth at bedtime.    [provider]  Multiple Vitamins-Minerals (MULTIVITAMIN WOMEN PO) Take 1 tablet by mouth daily.    [provider]    Allergies    Patient has no known allergies.  Review of Systems   Review of Systems  Gastrointestinal: Negative for vomiting.  Musculoskeletal: Negative for neck pain.  All other systems reviewed and are negative.   Physical Exam Updated Vital Signs BP (!) 166/99   Pulse 74   Temp 97.9 F (36.6 C) (Oral)   Resp 18   Ht 5' 5.25" (1.657 m)   Wt 73 kg   SpO2 97%   BMI 26.59 kg/m   Physical Exam Vitals and nursing note reviewed.  Constitutional:      Appearance: She is well-developed.  HENT:     Head: Normocephalic.     Comments: 2cm laceration scalp Cardiovascular:     Rate and Rhythm: Normal rate.  Pulmonary:     Effort: Pulmonary effort is normal.  Abdominal:     General: There is no distension.  Musculoskeletal:        General: Normal range of motion.     Cervical back: Normal range of motion.  Skin:    General: Skin is warm.  Neurological:     General: No focal deficit present.     Mental Status: She is alert and oriented to person, place, and time.  Psychiatric:        Mood and Affect: Mood normal.     ED Results / Procedures / Treatments   Labs (all labs ordered are listed, but only abnormal results are displayed) Labs  Reviewed - No data to display  EKG None  Radiology CT Head Wo Contrast  Result Date: 01/13/2020 CLINICAL DATA:  Minor head trauma. EXAM: CT HEAD WITHOUT CONTRAST CT CERVICAL SPINE WITHOUT CONTRAST TECHNIQUE: Multidetector CT imaging of the head and cervical spine was performed following the standard protocol without intravenous contrast. Multiplanar CT image reconstructions of the cervical spine were also generated. COMPARISON:  None. FINDINGS: CT HEAD FINDINGS Brain: No evidence of acute infarction, hemorrhage, hydrocephalus, extra-axial collection or mass lesion/mass effect. A mild chronic diffuse atrophy is identified. Mild chronic bilateral periventricular white matter small vessel ischemic changes are noted. Vascular: No hyperdense vessel is noted. Skull: Normal. Negative for fracture or focal lesion. Sinuses/Orbits: Minimal mucoperiosteal thickening of the left sphenoid sinus is noted. Other: None. CT CERVICAL SPINE FINDINGS Alignment: There is kyphosis of the cervical spine. Skull base and vertebrae: No acute fracture. No primary bone lesion or focal pathologic process. Soft tissues and spinal canal: No prevertebral fluid or swelling. No visible canal hematoma. Disc levels: Degenerative joint changes with narrowed joint space and osteophyte formation are identified throughout the mid to lower cervical spine more prominently involving the C4, C5, C6 and C7 levels. Upper chest: Biapical scar are noted. Other: None. IMPRESSION: 1. No focal acute intracranial abnormality identified. 2. Mild chronic diffuse atrophy and chronic bilateral periventricular white matter small vessel ischemic change. 3. No acute fracture or dislocation of cervical spine. 4. Degenerative joint changes of the cervical spine. Electronically Signed   By: Abelardo Diesel M.D.   On: 01/13/2020 11:18   CT Cervical Spine Wo Contrast  Result Date: 01/13/2020 CLINICAL DATA:  Minor head trauma. EXAM: CT HEAD WITHOUT CONTRAST CT CERVICAL  SPINE WITHOUT CONTRAST TECHNIQUE: Multidetector CT imaging of the head and cervical spine was performed following the standard protocol without intravenous contrast. Multiplanar CT image reconstructions of the cervical spine were also generated. COMPARISON:  None. FINDINGS: CT HEAD FINDINGS Brain: No evidence of acute infarction, hemorrhage, hydrocephalus, extra-axial collection or mass lesion/mass effect. A mild chronic diffuse atrophy is identified. Mild chronic bilateral periventricular white matter small vessel ischemic changes are noted. Vascular: No hyperdense vessel is noted. Skull: Normal. Negative for fracture or focal lesion. Sinuses/Orbits: Minimal mucoperiosteal thickening of the left sphenoid sinus is noted. Other: None. CT CERVICAL SPINE FINDINGS Alignment: There is kyphosis of the cervical spine. Skull base and vertebrae: No acute fracture. No primary bone lesion or focal pathologic process. Soft tissues and spinal canal: No prevertebral fluid or swelling. No visible canal hematoma. Disc levels: Degenerative joint changes with narrowed joint space and  osteophyte formation are identified throughout the mid to lower cervical spine more prominently involving the C4, C5, C6 and C7 levels. Upper chest: Biapical scar are noted. Other: None. IMPRESSION: 1. No focal acute intracranial abnormality identified. 2. Mild chronic diffuse atrophy and chronic bilateral periventricular white matter small vessel ischemic change. 3. No acute fracture or dislocation of cervical spine. 4. Degenerative joint changes of the cervical spine. Electronically Signed   By: Abelardo Diesel M.D.   On: 01/13/2020 11:18    Procedures .Marland KitchenLaceration Repair  Date/Time: 01/14/2020 8:28 AM Performed by: Fransico Meadow, PA-C Authorized by: Fransico Meadow, PA-C   Consent:    Consent obtained:  Verbal   Consent given by:  Patient   Risks discussed:  Infection   Alternatives discussed:  No treatment Anesthesia (see MAR for exact  dosages):    Anesthesia method:  None Laceration details:    Location:  Scalp Repair type:    Repair type:  Simple Treatment:    Area cleansed with:  Betadine   Amount of cleaning:  Standard   Visualized foreign bodies/material removed: no   Skin repair:    Repair method:  Tissue adhesive Approximation:    Approximation:  Loose Post-procedure details:    Patient tolerance of procedure:  Tolerated well, no immediate complications   (including critical care time)  Medications Ordered in ED Medications - No data to display  ED Course  I have reviewed the triage vital signs and the nursing notes.  Pertinent labs & imaging results that were available during my care of the patient were reviewed by me and considered in my medical decision making (see chart for details).    MDM Rules/Calculators/A&P                          MDM:  Ct head and neck  No fracture/  Pt counseled on results.  Laceration is superficial, pt has minimal hair in the area.  Dermabond used.  Final Clinical Impression(s) / ED Diagnoses Final diagnoses:  Laceration of scalp, initial encounter    Rx / DC Orders ED Discharge Orders    None    An After Visit Summary was printed and given to the patient.   Fransico Meadow, Hershal Coria 01/14/20 0830    Blanchie Dessert, MD 01/21/20 2210

## 2021-04-29 IMAGING — CT CT CERVICAL SPINE W/O CM
3 of 4 series · 12 of 33 positions shown, 14 images · non-contrast
Comparison: None.

CLINICAL DATA: Minor head trauma.

EXAM:
CT HEAD WITHOUT CONTRAST
CT CERVICAL SPINE WITHOUT CONTRAST
TECHNIQUE: Multidetector CT imaging of the head and cervical spine was
performed following the standard protocol without intravenous
contrast. Multiplanar CT image reconstructions of the cervical spine
were also generated.

[Series 5: coronals · coronal · 0.30mm/px · 3 of 81 slices shown]
[im 20/81  bone]
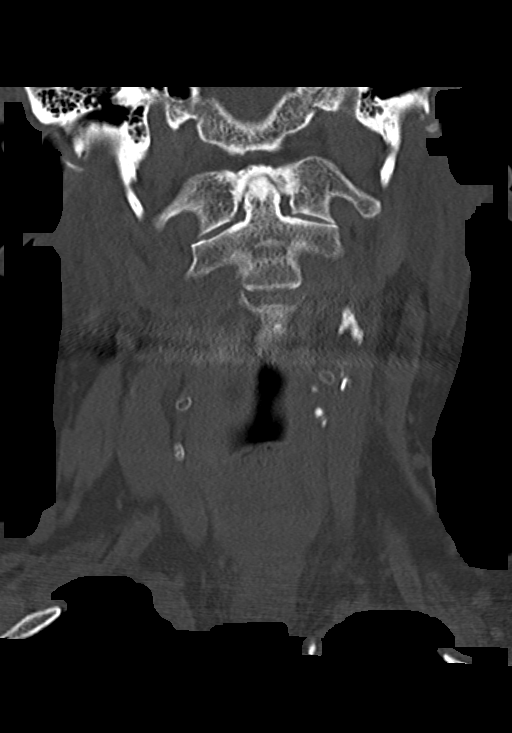
[im 34/81  bone]
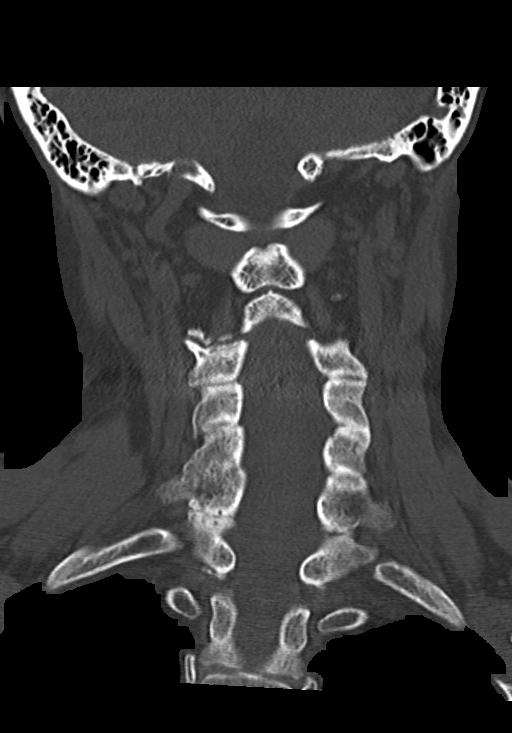
[im 48/81  bone]
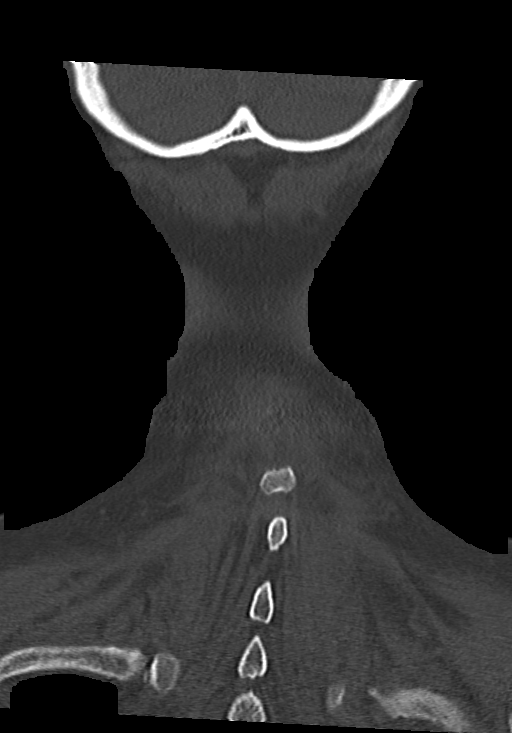

[Series 6: sagittals · sagittal · 0.28mm/px · 5 of 69 slices shown, 6 images]
[im 23/69  bone]
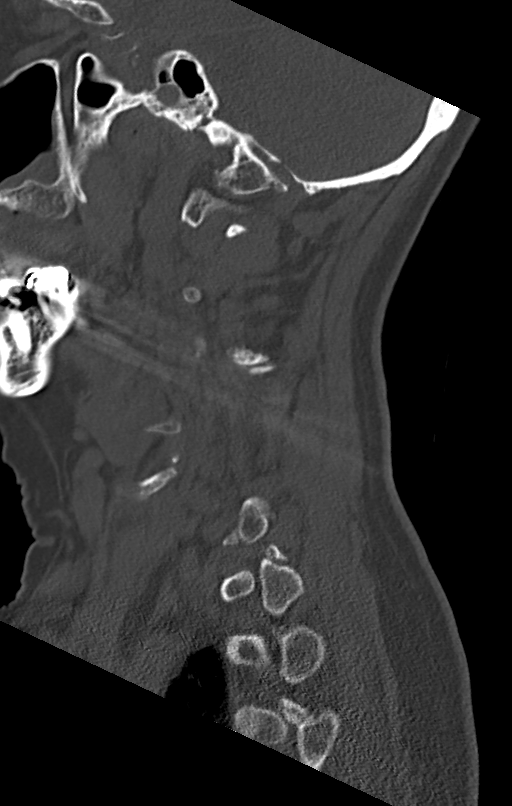
[im 29/69  bone]
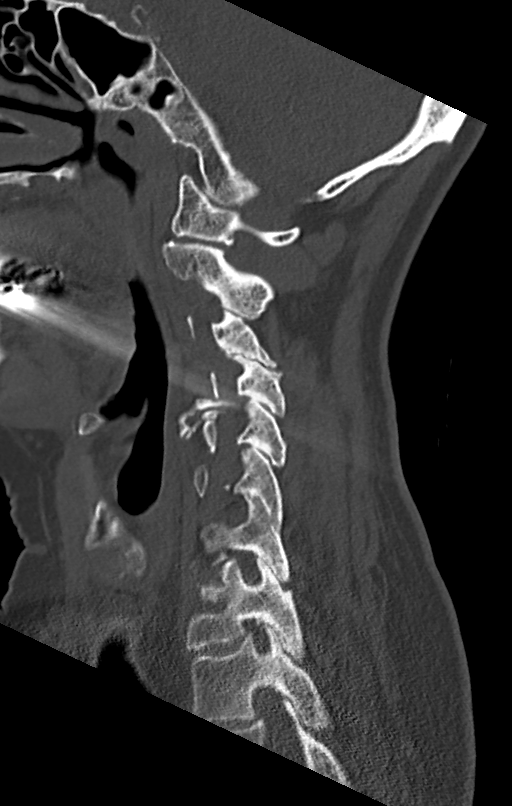
[im 35/69  soft-tissue]
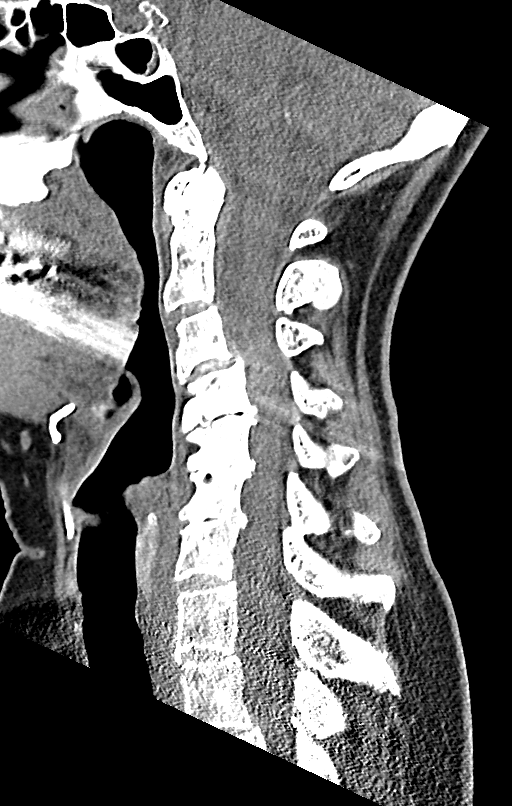
[im 35/69  bone]
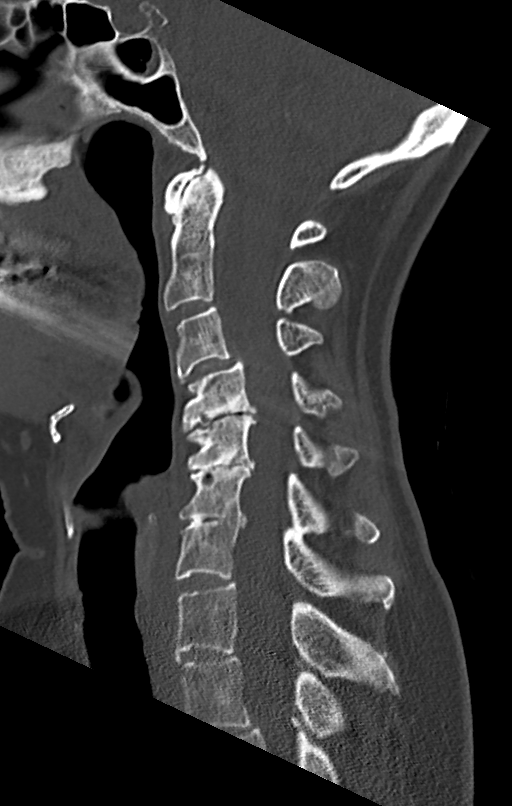
[im 40/69  bone]
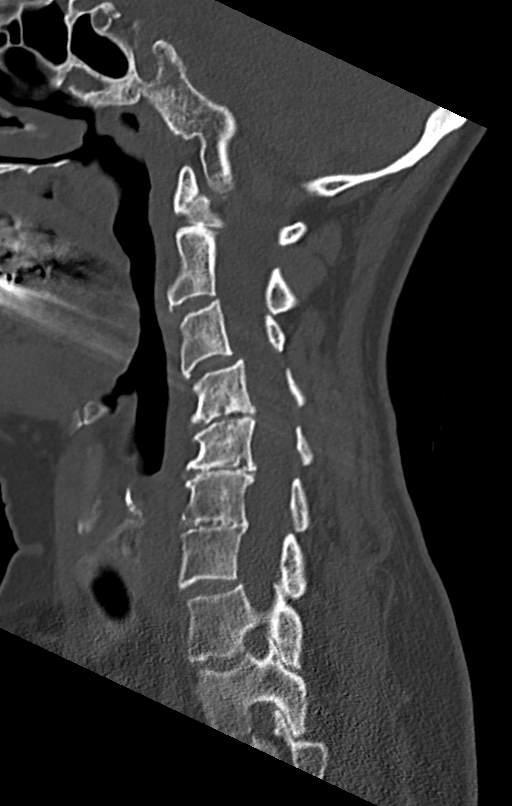
[im 46/69  bone]
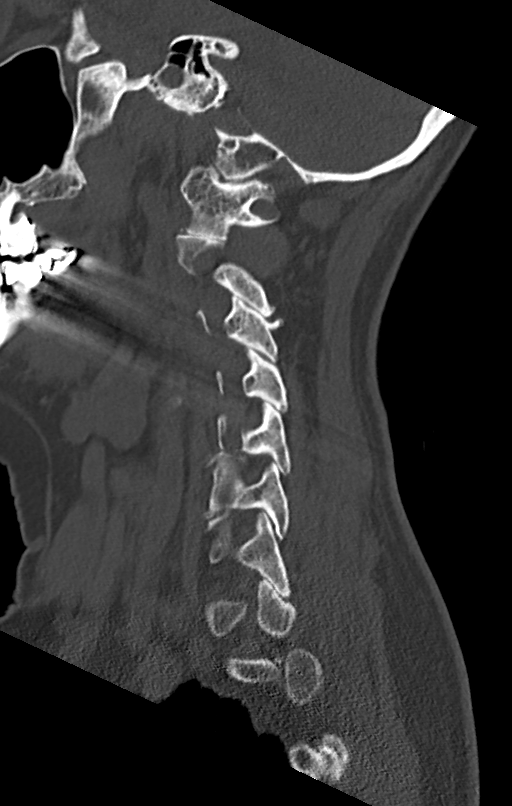

[Series 7: orthogonals · axial · 0.30mm/px · z∈[-330,-184]mm · 4 of 115 slices shown, 5 images]
[im 17/115  soft-tissue]
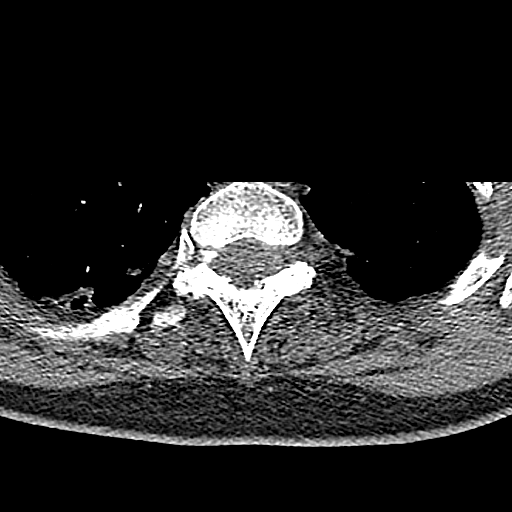
[im 17/115  bone]
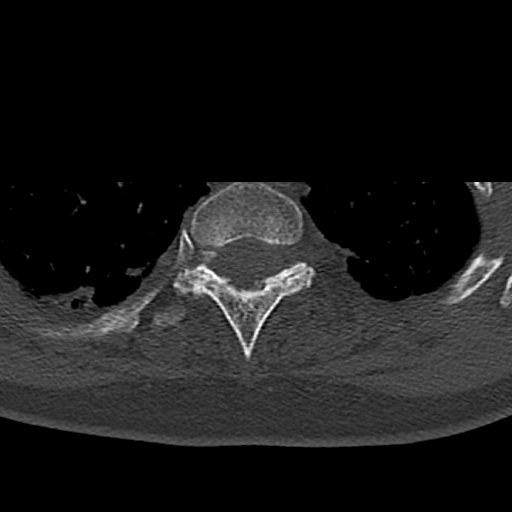
[im 49/115  bone]
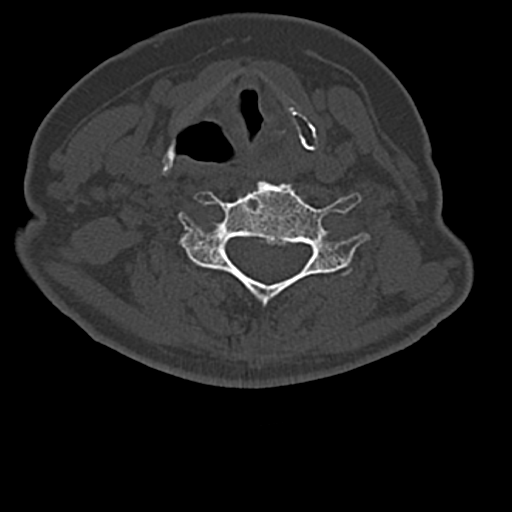
[im 66/115  bone]
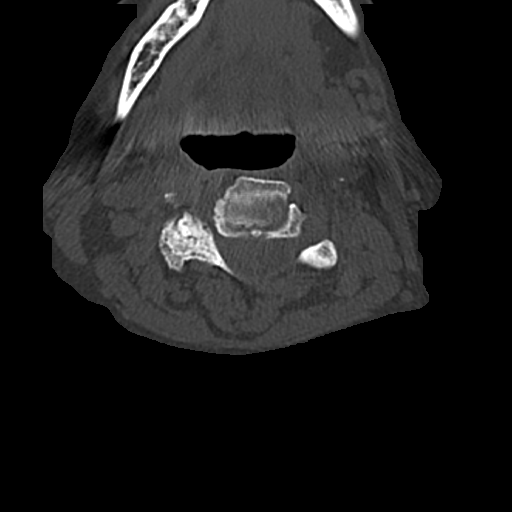
[im 98/115  bone]
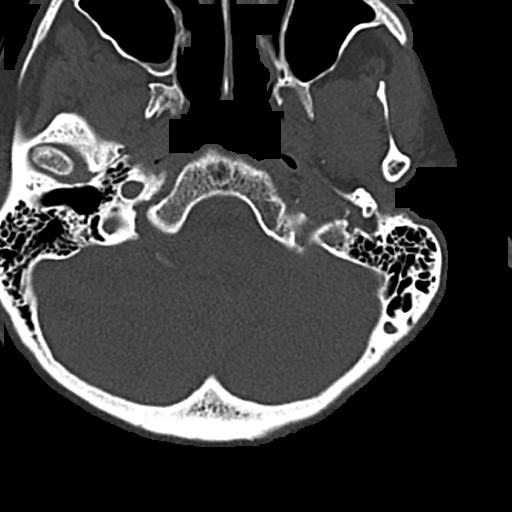

[12 of 33 positions shown; findings below may reference images not displayed]

FINDINGS: CT HEAD FINDINGS

Brain: No evidence of acute infarction, hemorrhage, hydrocephalus,
extra-axial collection or mass lesion/mass effect. A mild chronic
diffuse atrophy is identified. Mild chronic bilateral
periventricular white matter small vessel ischemic changes are
noted.

Vascular: No hyperdense vessel is noted.

Skull: Normal. Negative for fracture or focal lesion.

Sinuses/Orbits: Minimal mucoperiosteal thickening of the left
sphenoid sinus is noted.

Other: None.

CT CERVICAL SPINE FINDINGS

Alignment: There is kyphosis of the cervical spine.

Skull base and vertebrae: No acute fracture. No primary bone lesion
or focal pathologic process.

Soft tissues and spinal canal: No prevertebral fluid or swelling. No
visible canal hematoma.

Disc levels: Degenerative joint changes with narrowed joint space
and osteophyte formation are identified throughout the mid to lower
cervical spine more prominently involving the C4, C5, C6 and C7
levels.

Upper chest: Biapical scar are noted.

Other: None.
IMPRESSION: 1. No focal acute intracranial abnormality identified.
2. Mild chronic diffuse atrophy and chronic bilateral
periventricular white matter small vessel ischemic change.
3. No acute fracture or dislocation of cervical spine.
4. Degenerative joint changes of the cervical spine.

## 2021-04-29 IMAGING — CT CT HEAD W/O CM
3 series · 15 of 47 positions shown, 18 images · non-contrast
Comparison: None.

CLINICAL DATA: Minor head trauma.

EXAM:
CT HEAD WITHOUT CONTRAST
CT CERVICAL SPINE WITHOUT CONTRAST
TECHNIQUE: Multidetector CT imaging of the head and cervical spine was
performed following the standard protocol without intravenous
contrast. Multiplanar CT image reconstructions of the cervical spine
were also generated.

[Series 2: head 5.0 h30s · axial · 0.43mm/px · z∈[-158,-33]mm · 9 of 30 slices shown, 12 images]
[im 3/30  brain]
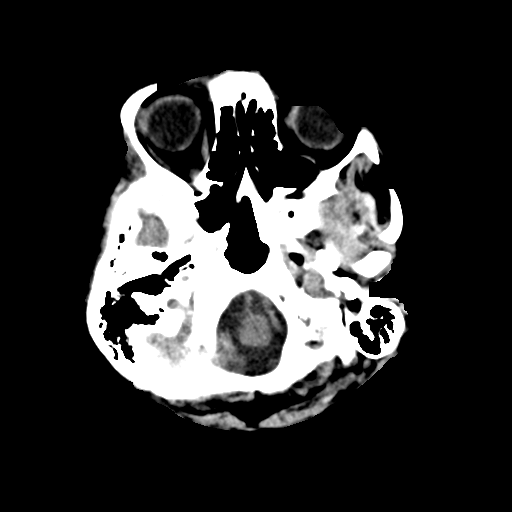
[im 3/30  bone]
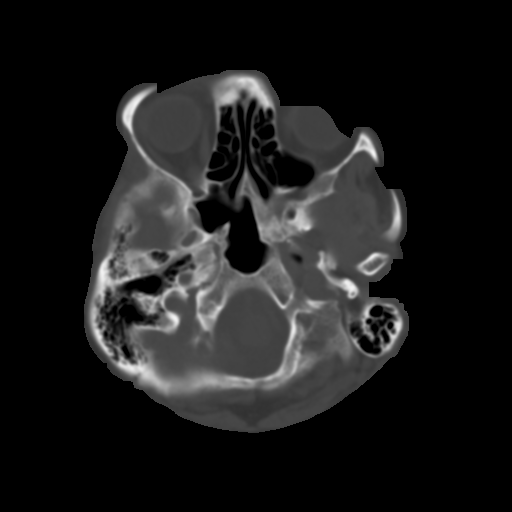
[im 6/30  brain]
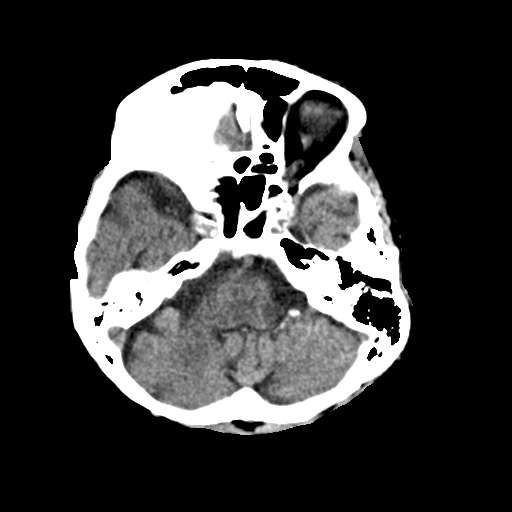
[im 9/30  brain]
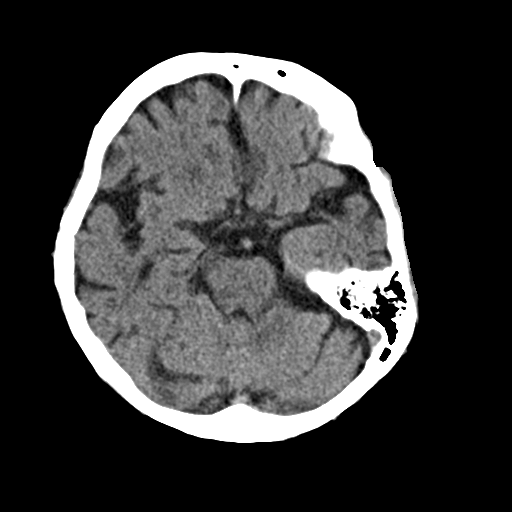
[im 12/30  brain]
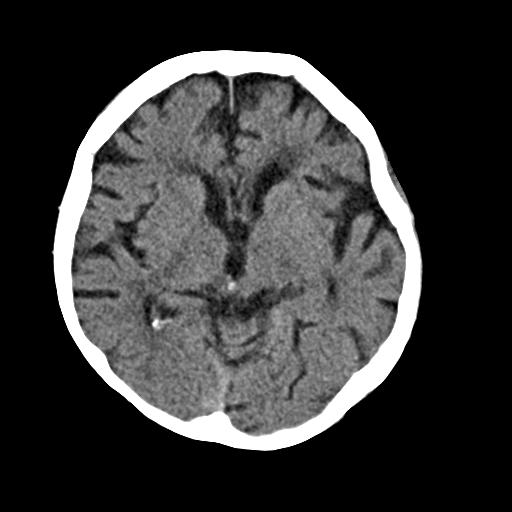
[im 16/30  brain]
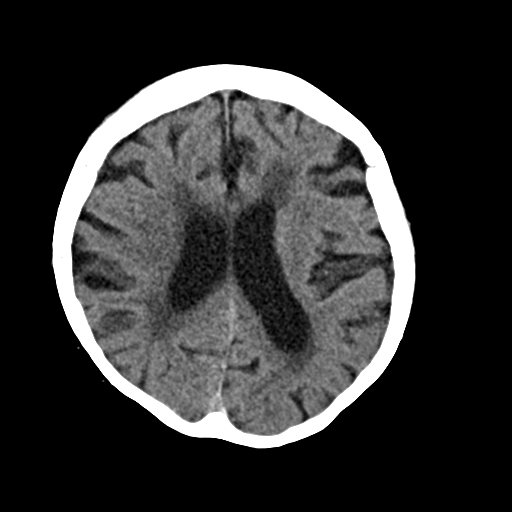
[im 16/30  bone]
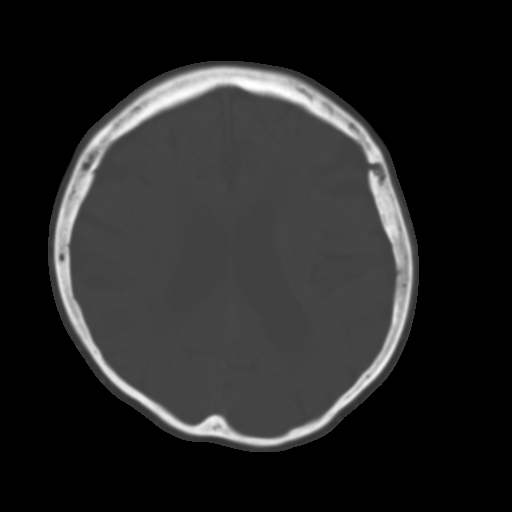
[im 19/30  brain]
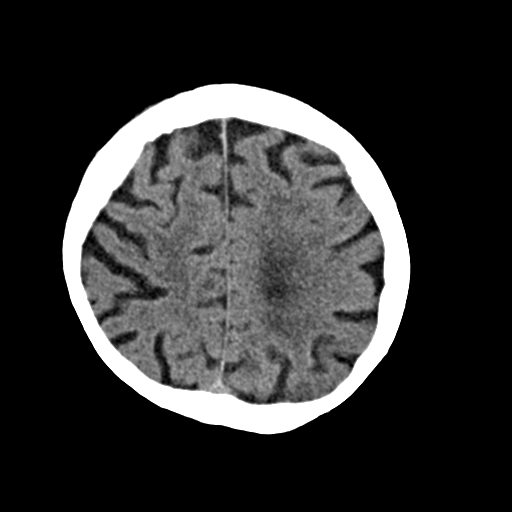
[im 22/30  brain]
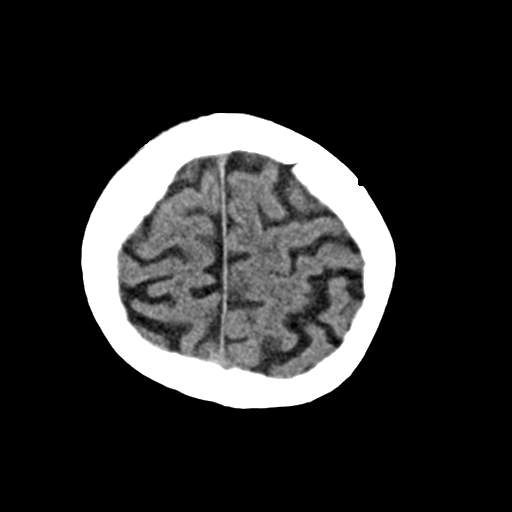
[im 25/30  brain]
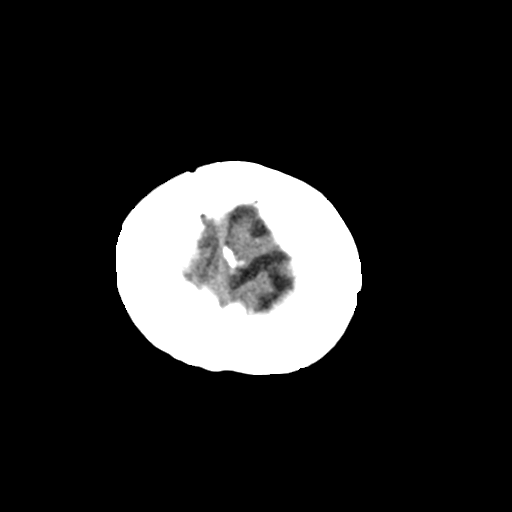
[im 28/30  brain]
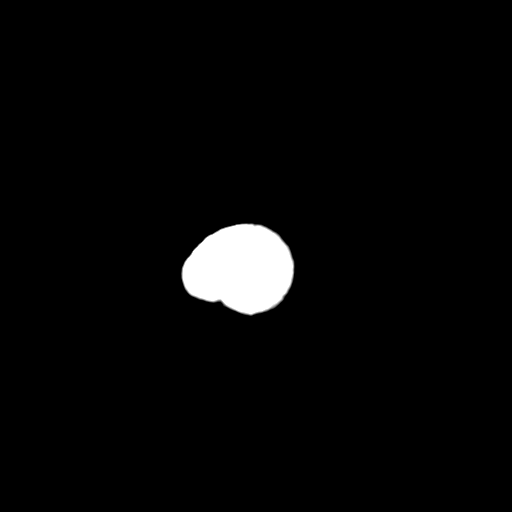
[im 28/30  bone]
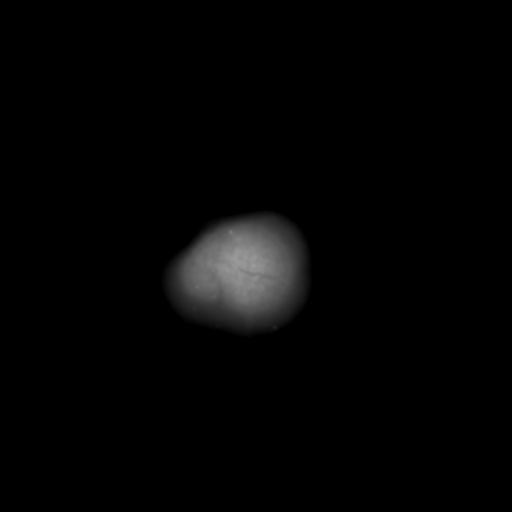

[Series 4: head 3.0 mpr cor · coronal · 0.29mm/px · 3 of 60 slices shown]
[im 20/60  brain]
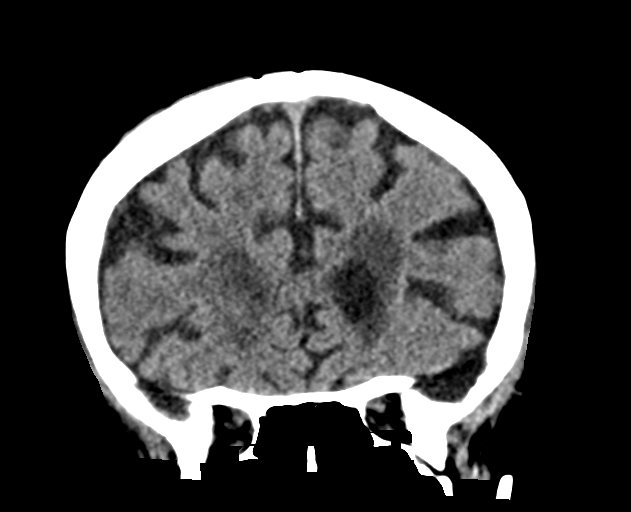
[im 27/60  brain]
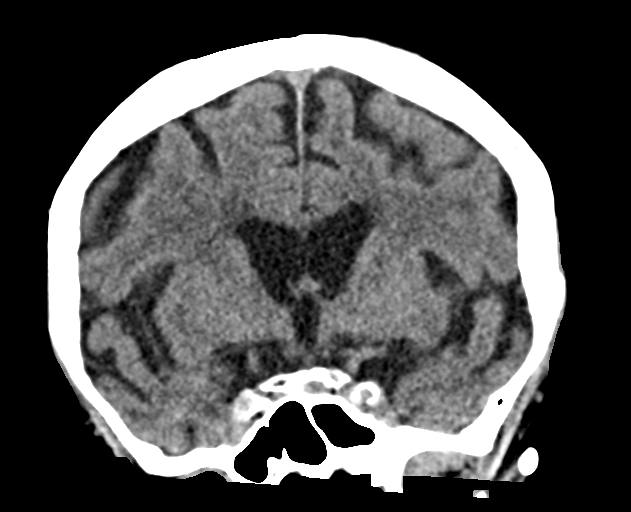
[im 33/60  brain]
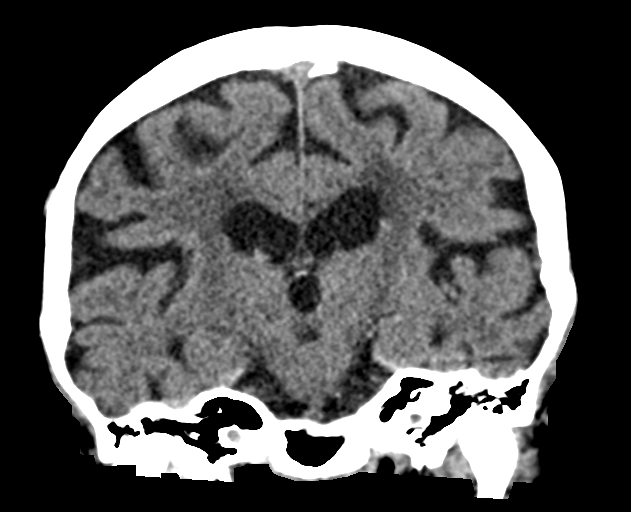

[Series 5: head 3.0 mpr sag · sagittal · 0.31mm/px · 3 of 57 slices shown]
[im 19/57  brain]
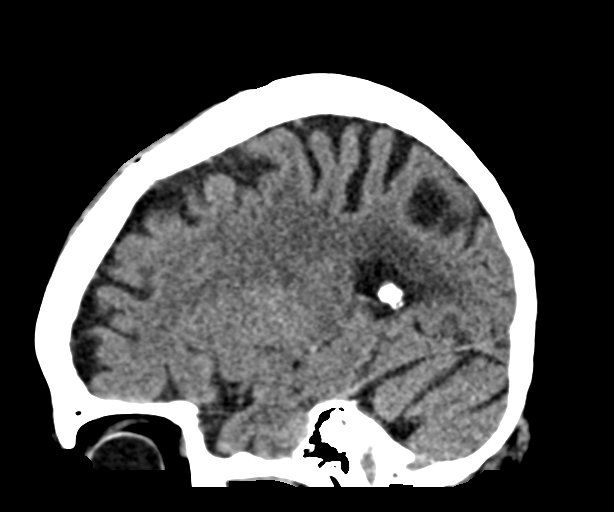
[im 29/57  brain]
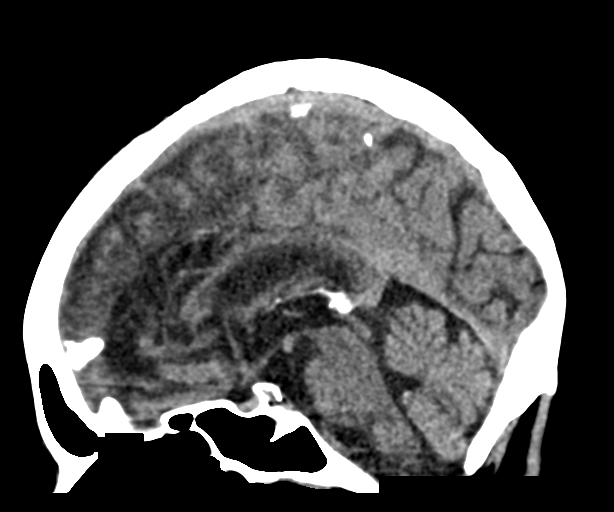
[im 38/57  brain]
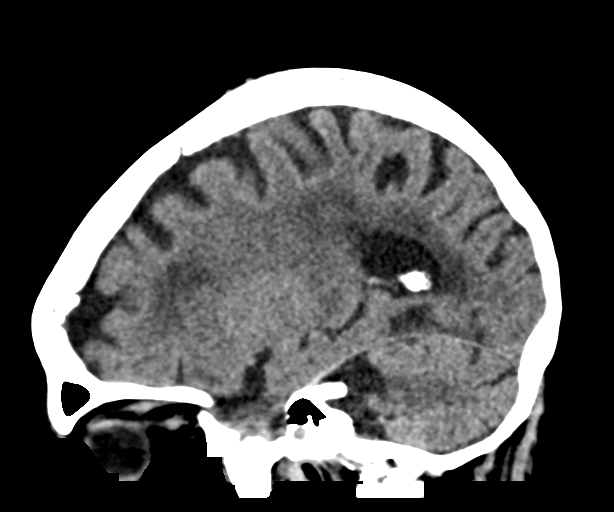

[15 of 47 positions shown; findings below may reference images not displayed]

FINDINGS: CT HEAD FINDINGS

Brain: No evidence of acute infarction, hemorrhage, hydrocephalus,
extra-axial collection or mass lesion/mass effect. A mild chronic
diffuse atrophy is identified. Mild chronic bilateral
periventricular white matter small vessel ischemic changes are
noted.

Vascular: No hyperdense vessel is noted.

Skull: Normal. Negative for fracture or focal lesion.

Sinuses/Orbits: Minimal mucoperiosteal thickening of the left
sphenoid sinus is noted.

Other: None.

CT CERVICAL SPINE FINDINGS

Alignment: There is kyphosis of the cervical spine.

Skull base and vertebrae: No acute fracture. No primary bone lesion
or focal pathologic process.

Soft tissues and spinal canal: No prevertebral fluid or swelling. No
visible canal hematoma.

Disc levels: Degenerative joint changes with narrowed joint space
and osteophyte formation are identified throughout the mid to lower
cervical spine more prominently involving the C4, C5, C6 and C7
levels.

Upper chest: Biapical scar are noted.

Other: None.
IMPRESSION: 1. No focal acute intracranial abnormality identified.
2. Mild chronic diffuse atrophy and chronic bilateral
periventricular white matter small vessel ischemic change.
3. No acute fracture or dislocation of cervical spine.
4. Degenerative joint changes of the cervical spine.

## 2022-07-08 ENCOUNTER — Emergency Department (HOSPITAL_BASED_OUTPATIENT_CLINIC_OR_DEPARTMENT_OTHER)
Admission: EM | Admit: 2022-07-08 | Discharge: 2022-07-08 | Disposition: A | Discharge status: Home / Self Care | Payer: Medicare Other | Attending: Emergency Medicine | Admitting: Emergency Medicine

## 2022-07-08 ENCOUNTER — Other Ambulatory Visit: Payer: Self-pay

## 2022-07-08 ENCOUNTER — Encounter (HOSPITAL_BASED_OUTPATIENT_CLINIC_OR_DEPARTMENT_OTHER): Payer: Self-pay

## 2022-07-08 ENCOUNTER — Emergency Department (HOSPITAL_BASED_OUTPATIENT_CLINIC_OR_DEPARTMENT_OTHER): Payer: Medicare Other

## 2022-07-08 DIAGNOSIS — F0390 Unspecified dementia without behavioral disturbance: Secondary | ICD-10-CM | POA: Diagnosis not present

## 2022-07-08 DIAGNOSIS — K760 Fatty (change of) liver, not elsewhere classified: Secondary | ICD-10-CM | POA: Diagnosis not present

## 2022-07-08 DIAGNOSIS — K5730 Diverticulosis of large intestine without perforation or abscess without bleeding: Secondary | ICD-10-CM | POA: Diagnosis not present

## 2022-07-08 DIAGNOSIS — I700 Atherosclerosis of aorta: Secondary | ICD-10-CM | POA: Insufficient documentation

## 2022-07-08 DIAGNOSIS — R197 Diarrhea, unspecified: Secondary | ICD-10-CM | POA: Insufficient documentation

## 2022-07-08 LAB — CBC WITH DIFFERENTIAL/PLATELET
Abs Immature Granulocytes: 0.02 10*3/uL (ref 0.00–0.07)
Basophils Absolute: 0.1 10*3/uL (ref 0.0–0.1)
Basophils Relative: 1 %
Eosinophils Absolute: 0.1 10*3/uL (ref 0.0–0.5)
Eosinophils Relative: 1 %
HCT: 33.8 % — ABNORMAL LOW (ref 36.0–46.0)
Hemoglobin: 11.1 g/dL — ABNORMAL LOW (ref 12.0–15.0)
Immature Granulocytes: 0 %
Lymphocytes Relative: 30 %
Lymphs Abs: 1.8 10*3/uL (ref 0.7–4.0)
MCH: 33.2 pg (ref 26.0–34.0)
MCHC: 32.8 g/dL (ref 30.0–36.0)
MCV: 101.2 fL — ABNORMAL HIGH (ref 80.0–100.0)
Monocytes Absolute: 0.6 10*3/uL (ref 0.1–1.0)
Monocytes Relative: 11 %
Neutro Abs: 3.5 10*3/uL (ref 1.7–7.7)
Neutrophils Relative %: 57 %
Platelets: 288 10*3/uL (ref 150–400)
RBC: 3.34 MIL/uL — ABNORMAL LOW (ref 3.87–5.11)
RDW: 12.2 % (ref 11.5–15.5)
WBC: 6.1 10*3/uL (ref 4.0–10.5)
nRBC: 0 % (ref 0.0–0.2)

## 2022-07-08 LAB — COMPREHENSIVE METABOLIC PANEL
ALT: 13 U/L (ref 0–44)
AST: 22 U/L (ref 15–41)
Albumin: 3.8 g/dL (ref 3.5–5.0)
Alkaline Phosphatase: 61 U/L (ref 38–126)
Anion gap: 9 (ref 5–15)
BUN: 16 mg/dL (ref 8–23)
CO2: 26 mmol/L (ref 22–32)
Calcium: 9.1 mg/dL (ref 8.9–10.3)
Chloride: 99 mmol/L (ref 98–111)
Creatinine, Ser: 0.99 mg/dL (ref 0.44–1.00)
GFR, Estimated: 55 mL/min — ABNORMAL LOW (ref >60)
Glucose, Bld: 133 mg/dL — ABNORMAL HIGH (ref 70–99)
Potassium: 3.7 mmol/L (ref 3.5–5.1)
Sodium: 134 mmol/L — ABNORMAL LOW (ref 135–145)
Total Bilirubin: 0.8 mg/dL (ref 0.3–1.2)
Total Protein: 7 g/dL (ref 6.5–8.1)

## 2022-07-08 LAB — MAGNESIUM: Magnesium: 2.3 mg/dL (ref 1.7–2.4)

## 2022-07-08 LAB — LIPASE, BLOOD: Lipase: 26 U/L (ref 11–51)

## 2022-07-08 MED ORDER — IOHEXOL 300 MG/ML  SOLN
100.0000 mL | Freq: Once | INTRAMUSCULAR | Status: AC | PRN
  Administered 2022-07-08: 100 mL via INTRAVENOUS

## 2022-07-08 NOTE — ED Notes (Signed)
Patient states she has diarrhea that comes without warning. States that sometimes it is a lot but denies any pain in her abdomen. Was here 3 years ago for a SBO and was transferred to [facility] for same.

## 2022-07-08 NOTE — ED Notes (Signed)
Pt and spouse verbally understood discharge instructions.  

## 2022-07-08 NOTE — Discharge Instructions (Addendum)
Thank you for coming to [facility] Emergency Department. [name] was seen for diarrhea/abdominal pain. We did an exam, labs, and imaging, and these showed no emergent findings.  Please follow up with her  primary care provider within 1-2 weeks if symptoms do not improve. Please also keep her hydrated.  Do not hesitate to return to the ED or call 911 if you experience: -Worsening symptoms -Nausea/vomiting -Abdominal pain -Lightheadedness, passing out -Fevers/chills -Anything else that concerns you

## 2022-07-08 NOTE — ED Provider Notes (Signed)
[facility] Provider Note   CSN: [id number] Arrival date & time: 07/08/22  1249     History  Chief Complaint  Patient presents with   Diarrhea    Kendra Ortega is a 89 y.o. female with history of dementia presenting to emergency department company of her husband with concern for explosive diarrhea ongoing for several days.  Husband reports that been going on for maybe 7 days.  She is not having frequent bowel movements but when she does have a bowel movement it is large and "explosive".  One of her doctors are prescribed Imodium which they thought it been helping but she had another very large bowel movement on Sunday, yesterday.  Patient herself has no acute complaints.  She is eating well at home.  Her husband was concerned because he reports she had similar symptoms in 2019 when she was in the ER, though she was having abdominal pain at that time which she is not having now.  Per my review of the records the patient was found to have a small bowel obstruction with a transition point in the central abdomen, likely related to adhesions.  No recent antibiotic usage.  The patient and her husband not aware of any prior history of C. difficile.  HPI     Home Medications Prior to Admission medications   Medication Sig Start Date End Date Taking? Authorizing Provider  calcium carbonate (CALCIUM 600) 600 MG TABS tablet Take 600 mg by mouth every other day. 01/28/12   [provider]  Cholecalciferol (VITAMIN D3) 1000 units CAPS Take 1,000 Units by mouth every other day. 03/01/13   [provider]  glimepiride (AMARYL) 1 MG tablet Take 1 mg by mouth daily with breakfast.    [provider]  levothyroxine (SYNTHROID, LEVOTHROID) 50 MCG tablet Take 50 mcg by mouth daily before breakfast.    [provider]  magnesium oxide (MAG-OX) 400 MG tablet Take 400 mg by mouth at bedtime.    [provider]   Multiple Vitamins-Minerals (MULTIVITAMIN WOMEN PO) Take 1 tablet by mouth daily.    [provider]      Allergies    Patient has no known allergies.    Review of Systems   Review of Systems  Physical Exam Updated Vital Signs BP (!) 117/59   Pulse 65   Temp 97.7 F (36.5 C)   Resp 20   Ht 5\' 6"  (1.676 m)   Wt 70.3 kg   SpO2 95%   BMI 25.02 kg/m  Physical Exam Constitutional:      General: She is not in acute distress. HENT:     Head: Normocephalic and atraumatic.  Eyes:     Conjunctiva/sclera: Conjunctivae normal.     Pupils: Pupils are equal, round, and reactive to light.  Cardiovascular:     Rate and Rhythm: Normal rate and regular rhythm.  Pulmonary:     Effort: Pulmonary effort is normal. No respiratory distress.  Abdominal:     General: There is no distension.     Tenderness: There is no abdominal tenderness.  Skin:    General: Skin is warm and dry.  Neurological:     General: No focal deficit present.     Mental Status: She is alert and oriented to person, place, and time. Mental status is at baseline.  Psychiatric:        Mood and Affect: Mood normal.        Behavior:  Behavior normal.     ED Results / Procedures / Treatments   Labs (all labs ordered are listed, but only abnormal results are displayed) Labs Reviewed  COMPREHENSIVE METABOLIC PANEL - Abnormal; Notable for the following components:      Result Value   Sodium 134 (*)    Glucose, Bld 133 (*)    GFR, Estimated 55 (*)    All other components within normal limits  CBC WITH DIFFERENTIAL/PLATELET - Abnormal; Notable for the following components:   RBC 3.34 (*)    Hemoglobin 11.1 (*)    HCT 33.8 (*)    MCV 101.2 (*)    All other components within normal limits  MAGNESIUM  LIPASE, BLOOD    EKG None  Radiology No results found.  Procedures Procedures    Medications Ordered in ED Medications - No data to display  ED Course/ Medical Decision Making/ A&P                              Medical Decision Making Amount and/or Complexity of Data Reviewed Labs: ordered. Radiology: ordered.   This patient presents to the ED with concern for diarrhea, irregular bowel movements. This involves an extensive number of treatment options, and is a complaint that carries with it a high risk of complications and morbidity.  The differential diagnosis includes IBS versus colitis versus other  Co-morbidities that complicate the patient evaluation: hx of abdominal surgeries, adhesions, SBO risk factor  Additional history obtained from the patient's husband   External records from outside source obtained and reviewed including admission in 2019 for SBO  I ordered and personally interpreted labs.  The pertinent results include:  WBC wnl; hgb at baseline 11.1;  I ordered imaging studies including CT abdomen pelvis with contrast, which was pending at the time of signout  The patient was maintained on a cardiac monitor.  I personally viewed and interpreted the cardiac monitored which showed an underlying rhythm of: NSR  I ordered medication including NS bolus  I have reviewed the patients home medicines and have made adjustments as needed  Test Considered: Doubt mesenteric ischemia, did not feel CT angiogram clinically indicated at this time   Dispostion:  Patient was signed out to afternoon ED provider at 3:15 pm pending f/u on CT imaging.         Final Clinical Impression(s) / ED Diagnoses Final diagnoses:  Diarrhea, unspecified type    Rx / DC Orders ED Discharge Orders     None         Wyvonnia Dusky, MD 07/08/22 1545

## 2022-07-08 NOTE — ED Provider Notes (Signed)
4:55 PM Assumed care of patient from off-going team. For more details, please see note from same day.  In brief, this is a 89 y.o. female w/ dementia who p/w diarrhea that comes without warning. Husband at bedside stated that she had an SBO 3 years ago that presented similarly. Reassuring labs.   Plan/Dispo at time of sign-out & ED Course since sign-out: [ ]  CT abd pelvis   BP (!) 166/71   Pulse 60   Temp 97.7 F (36.5 C)   Resp 20   Ht 5\' 6"  (1.676 m)   Wt 70.3 kg   SpO2 96%   BMI 25.02 kg/m    ED Course:   Clinical Course as of 07/08/22 1655  Mon Jul 08, 2022  1634 CT ABDOMEN PELVIS W CONTRAST 1. Hepatic steatosis. 2. Sigmoid diverticulosis. 3. Findings consistent with a benign right adrenal adenoma. No follow-up imaging is recommended. This recommendation follows ACR consensus guidelines: Management of Incidental Adrenal Masses: A White Paper of the ACR Incidental Findings Committee. J Am Coll Radiol 2017;14:1038-1044. 4. Aortic atherosclerosis.   [HN]  [id number] No SBO noted on CT abd pelvis. [HN]  [id number] Patient reevaluated. Informed she and her husband about reassuring imaging/lab findings. Confirmed still no abdominal pain. No diarrhea durin gher 4 hour stay in the ED. Recommended PCP and GI followup, given [facility] for [facility]. Discussed dietary changes and hydration, OTC probiotics. Husband reports understanding. DC w/ discharge instructions/return precautions. All questions answered to patient's satisfaction.   [HN]    Clinical Course User Index [HN] Audley Hose, MD   ------------------------------- Cindee Lame, MD Emergency Medicine  This note was created using dictation software, which may contain spelling or grammatical errors.   Audley Hose, MD 07/08/22 [phone]

## 2022-07-08 NOTE — ED Triage Notes (Signed)
Pt c/o diarrhea for about a week. Pt denies nausea or vomiting.

## 2023-09-19 ENCOUNTER — Other Ambulatory Visit (HOSPITAL_COMMUNITY): Payer: Self-pay | Admitting: *Deleted

## 2023-09-19 DIAGNOSIS — D649 Anemia, unspecified: Secondary | ICD-10-CM

## 2023-09-22 ENCOUNTER — Ambulatory Visit (HOSPITAL_COMMUNITY)
Admission: RE | Admit: 2023-09-22 | Discharge: 2023-09-22 | Disposition: A | Discharge status: Home / Self Care | Source: Ambulatory Visit | Attending: Family Medicine | Admitting: Family Medicine

## 2023-09-22 DIAGNOSIS — D649 Anemia, unspecified: Secondary | ICD-10-CM

## 2023-09-22 DIAGNOSIS — Z96642 Presence of left artificial hip joint: Secondary | ICD-10-CM | POA: Insufficient documentation

## 2023-09-22 DIAGNOSIS — D62 Acute posthemorrhagic anemia: Secondary | ICD-10-CM | POA: Insufficient documentation

## 2023-09-22 LAB — ABO/RH: ABO/RH(D): O NEG

## 2023-09-22 LAB — PREPARE RBC (CROSSMATCH)

## 2023-09-22 MED ORDER — SODIUM CHLORIDE 0.9% IV SOLUTION: Freq: Once | INTRAVENOUS | Status: DC

## 2023-09-23 LAB — BPAM RBC
Blood Product Expiration Date: 202506092359
ISSUE DATE / TIME: 202506020941
Unit Type and Rh: 9500

## 2023-09-23 LAB — TYPE AND SCREEN
ABO/RH(D): O NEG
Antibody Screen: NEGATIVE
Unit division: 0
